# Patient Record
Sex: Female | Born: 1985 | Race: White | Hispanic: No | Marital: Single | Smoking: Never smoker
Health system: Southern US, Community
[De-identification: ages and names within clinical notes are randomized; demographics above are authoritative.]

---
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
## 2007-09-28 ENCOUNTER — Encounter: Admission: RE | Admit: 2007-09-28 | Discharge: 2007-09-28 | Payer: Self-pay

## 2015-10-27 DIAGNOSIS — M545 Low back pain: Secondary | ICD-10-CM | POA: Diagnosis not present

## 2015-11-16 DIAGNOSIS — M545 Low back pain: Secondary | ICD-10-CM | POA: Diagnosis not present

## 2016-01-17 DIAGNOSIS — Z6825 Body mass index (BMI) 25.0-25.9, adult: Secondary | ICD-10-CM | POA: Diagnosis not present

## 2016-01-17 DIAGNOSIS — Z113 Encounter for screening for infections with a predominantly sexual mode of transmission: Secondary | ICD-10-CM | POA: Diagnosis not present

## 2016-01-17 DIAGNOSIS — Z01419 Encounter for gynecological examination (general) (routine) without abnormal findings: Secondary | ICD-10-CM | POA: Diagnosis not present

## 2016-01-17 DIAGNOSIS — Z1159 Encounter for screening for other viral diseases: Secondary | ICD-10-CM | POA: Diagnosis not present

## 2016-01-17 DIAGNOSIS — Z36 Encounter for antenatal screening of mother: Secondary | ICD-10-CM | POA: Diagnosis not present

## 2016-03-07 DIAGNOSIS — F4322 Adjustment disorder with anxiety: Secondary | ICD-10-CM | POA: Diagnosis not present

## 2016-03-27 ENCOUNTER — Ambulatory Visit
Admission: RE | Admit: 2016-03-27 | Discharge: 2016-03-27 | Disposition: A | Discharge status: Home / Self Care | Payer: BLUE CROSS/BLUE SHIELD | Source: Ambulatory Visit | Attending: Family Medicine | Admitting: Family Medicine

## 2016-03-27 ENCOUNTER — Other Ambulatory Visit: Payer: Self-pay | Admitting: Family Medicine

## 2016-03-27 DIAGNOSIS — M546 Pain in thoracic spine: Secondary | ICD-10-CM | POA: Diagnosis not present

## 2016-03-27 DIAGNOSIS — R5383 Other fatigue: Secondary | ICD-10-CM | POA: Diagnosis not present

## 2016-03-27 DIAGNOSIS — M549 Dorsalgia, unspecified: Secondary | ICD-10-CM | POA: Diagnosis not present

## 2016-03-27 DIAGNOSIS — R52 Pain, unspecified: Secondary | ICD-10-CM

## 2016-03-29 DIAGNOSIS — F4322 Adjustment disorder with anxiety: Secondary | ICD-10-CM | POA: Diagnosis not present

## 2016-04-09 DIAGNOSIS — L650 Telogen effluvium: Secondary | ICD-10-CM | POA: Diagnosis not present

## 2016-04-09 DIAGNOSIS — Z79899 Other long term (current) drug therapy: Secondary | ICD-10-CM | POA: Diagnosis not present

## 2016-04-09 DIAGNOSIS — L659 Nonscarring hair loss, unspecified: Secondary | ICD-10-CM | POA: Diagnosis not present

## 2016-05-04 DIAGNOSIS — Z23 Encounter for immunization: Secondary | ICD-10-CM | POA: Diagnosis not present

## 2016-06-21 DIAGNOSIS — F4322 Adjustment disorder with anxiety: Secondary | ICD-10-CM | POA: Diagnosis not present

## 2016-06-25 DIAGNOSIS — E559 Vitamin D deficiency, unspecified: Secondary | ICD-10-CM | POA: Diagnosis not present

## 2016-07-08 DIAGNOSIS — J069 Acute upper respiratory infection, unspecified: Secondary | ICD-10-CM | POA: Diagnosis not present

## 2016-09-13 DIAGNOSIS — F4322 Adjustment disorder with anxiety: Secondary | ICD-10-CM | POA: Diagnosis not present

## 2016-09-30 DIAGNOSIS — F4322 Adjustment disorder with anxiety: Secondary | ICD-10-CM | POA: Diagnosis not present

## 2016-10-04 DIAGNOSIS — F4322 Adjustment disorder with anxiety: Secondary | ICD-10-CM | POA: Diagnosis not present

## 2016-10-11 DIAGNOSIS — F4322 Adjustment disorder with anxiety: Secondary | ICD-10-CM | POA: Diagnosis not present

## 2017-01-08 DIAGNOSIS — F4322 Adjustment disorder with anxiety: Secondary | ICD-10-CM | POA: Diagnosis not present

## 2017-01-24 DIAGNOSIS — F4322 Adjustment disorder with anxiety: Secondary | ICD-10-CM | POA: Diagnosis not present

## 2017-03-13 DIAGNOSIS — R194 Change in bowel habit: Secondary | ICD-10-CM | POA: Diagnosis not present

## 2017-03-13 DIAGNOSIS — Z800 Family history of malignant neoplasm of digestive organs: Secondary | ICD-10-CM | POA: Diagnosis not present

## 2017-03-13 DIAGNOSIS — R151 Fecal smearing: Secondary | ICD-10-CM | POA: Diagnosis not present

## 2017-03-29 IMAGING — CR DG THORACOLUMBAR SPINE 2V
2 series · 2 of 2 positions shown · non-contrast
Comparison: None in PACs

CLINICAL DATA: Mid thoracic and low back pain for the past several
weeks. Patient does a good bit of lifting with her job.

EXAM:
THORACOLUMBAR SPINE 1V

[w t-spine/l-spine junc. ap]
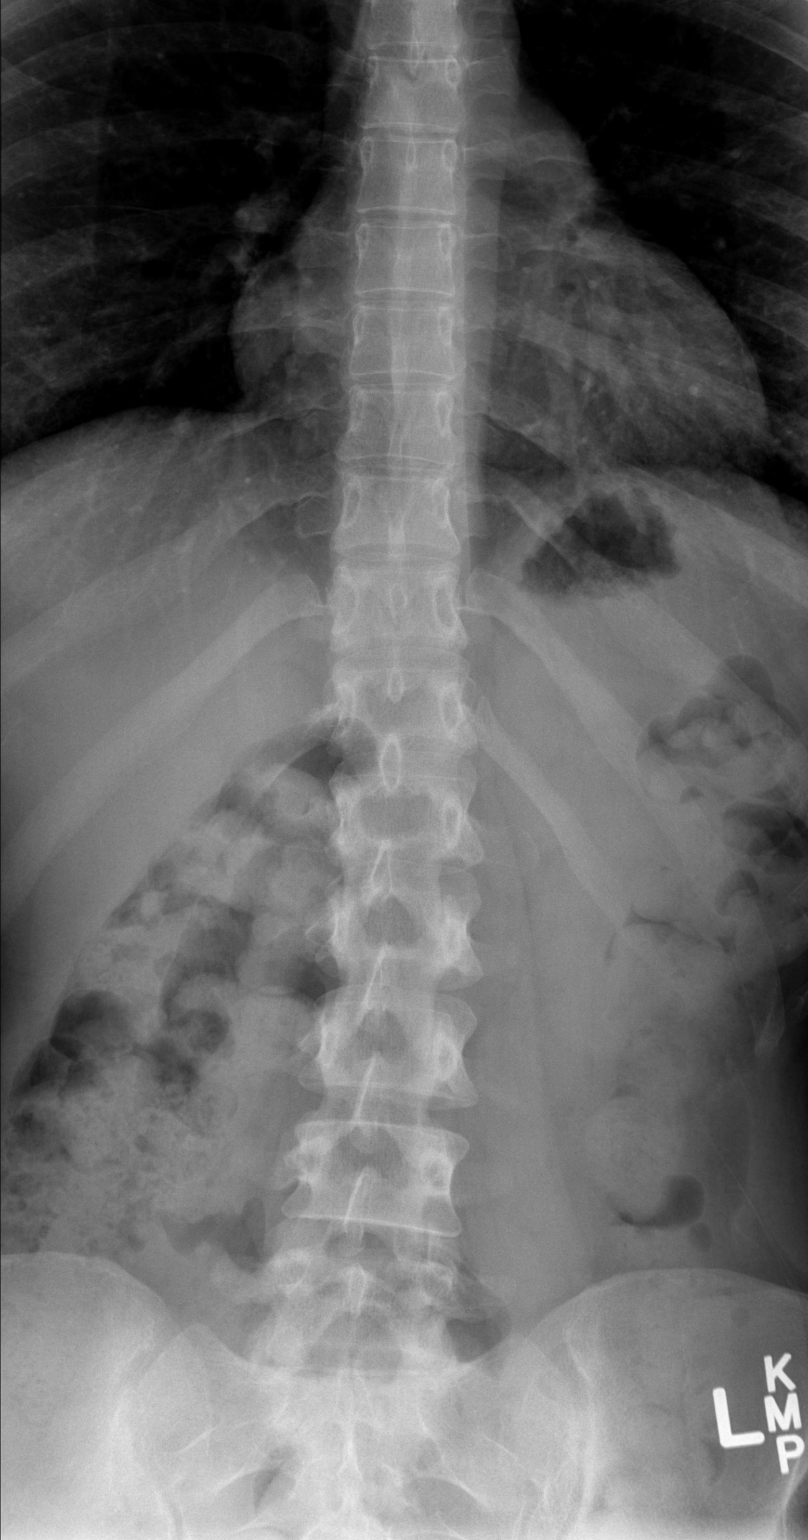

[w t-spine/l-spine junc lat]
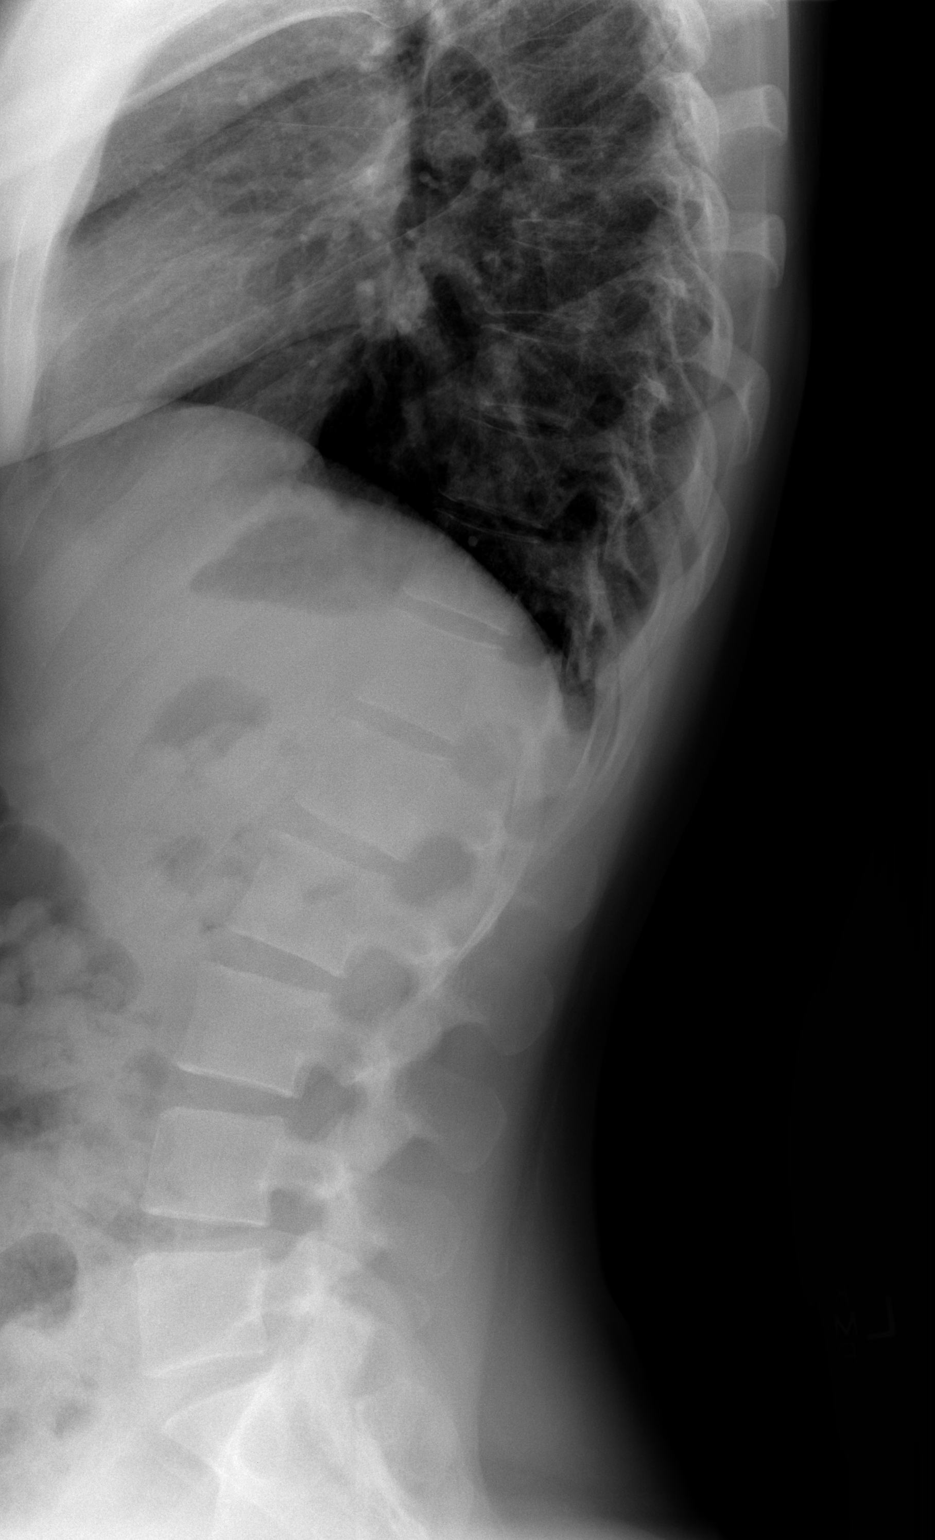

[2 of 2 positions shown; findings below may reference images not displayed]

FINDINGS: The thoracic spine from T5 inferiorly is included in the field of
view. The entire lumbar spine is included. The vertebral bodies are
preserved in height. The disc space heights are well maintained.
There is no spondylolisthesis. The pedicles are intact. The
transverse processes of the lumbar vertebral levels appear normal.
No abnormal paravertebral soft tissue densities are noted in the
lower thoracic spine.
IMPRESSION: There is no acute or significant chronic bony abnormality of the
visualized portions of the thoracic spine nor of the lumbar spine.

## 2017-04-03 DIAGNOSIS — Z800 Family history of malignant neoplasm of digestive organs: Secondary | ICD-10-CM | POA: Diagnosis not present

## 2017-04-03 DIAGNOSIS — Z1211 Encounter for screening for malignant neoplasm of colon: Secondary | ICD-10-CM | POA: Diagnosis not present

## 2017-04-03 DIAGNOSIS — K640 First degree hemorrhoids: Secondary | ICD-10-CM | POA: Diagnosis not present

## 2017-04-03 DIAGNOSIS — D125 Benign neoplasm of sigmoid colon: Secondary | ICD-10-CM | POA: Diagnosis not present

## 2017-04-08 DIAGNOSIS — D125 Benign neoplasm of sigmoid colon: Secondary | ICD-10-CM | POA: Diagnosis not present

## 2017-04-18 DIAGNOSIS — F419 Anxiety disorder, unspecified: Secondary | ICD-10-CM | POA: Diagnosis not present

## 2017-04-18 DIAGNOSIS — M722 Plantar fascial fibromatosis: Secondary | ICD-10-CM | POA: Diagnosis not present

## 2017-04-18 DIAGNOSIS — R41840 Attention and concentration deficit: Secondary | ICD-10-CM | POA: Diagnosis not present

## 2017-05-02 DIAGNOSIS — H5213 Myopia, bilateral: Secondary | ICD-10-CM | POA: Diagnosis not present

## 2017-05-02 DIAGNOSIS — H52203 Unspecified astigmatism, bilateral: Secondary | ICD-10-CM | POA: Diagnosis not present

## 2017-05-08 DIAGNOSIS — F4322 Adjustment disorder with anxiety: Secondary | ICD-10-CM | POA: Diagnosis not present

## 2017-05-15 DIAGNOSIS — F900 Attention-deficit hyperactivity disorder, predominantly inattentive type: Secondary | ICD-10-CM | POA: Diagnosis not present

## 2017-05-15 DIAGNOSIS — F4322 Adjustment disorder with anxiety: Secondary | ICD-10-CM | POA: Diagnosis not present

## 2017-05-19 DIAGNOSIS — F900 Attention-deficit hyperactivity disorder, predominantly inattentive type: Secondary | ICD-10-CM | POA: Diagnosis not present

## 2017-05-19 DIAGNOSIS — M25559 Pain in unspecified hip: Secondary | ICD-10-CM | POA: Diagnosis not present

## 2017-05-20 ENCOUNTER — Encounter (HOSPITAL_COMMUNITY): Payer: Self-pay

## 2017-05-20 ENCOUNTER — Emergency Department (HOSPITAL_COMMUNITY)
Admission: EM | Admit: 2017-05-20 | Discharge: 2017-05-21 | Disposition: A | Discharge status: Home / Self Care | Payer: BLUE CROSS/BLUE SHIELD | Attending: Emergency Medicine | Admitting: Emergency Medicine

## 2017-05-20 DIAGNOSIS — Y999 Unspecified external cause status: Secondary | ICD-10-CM | POA: Insufficient documentation

## 2017-05-20 DIAGNOSIS — Z23 Encounter for immunization: Secondary | ICD-10-CM | POA: Diagnosis not present

## 2017-05-20 DIAGNOSIS — S51802A Unspecified open wound of left forearm, initial encounter: Secondary | ICD-10-CM | POA: Insufficient documentation

## 2017-05-20 DIAGNOSIS — Y929 Unspecified place or not applicable: Secondary | ICD-10-CM | POA: Insufficient documentation

## 2017-05-20 DIAGNOSIS — Y939 Activity, unspecified: Secondary | ICD-10-CM | POA: Diagnosis not present

## 2017-05-20 DIAGNOSIS — W5501XA Bitten by cat, initial encounter: Secondary | ICD-10-CM | POA: Diagnosis not present

## 2017-05-20 MED ORDER — AMOXICILLIN-POT CLAVULANATE 875-125 MG PO TABS: 1.0000 | ORAL_TABLET | Freq: Two times a day (BID) | ORAL | 0 refills | Status: DC

## 2017-05-20 MED ORDER — TETANUS-DIPHTH-ACELL PERTUSSIS 5-2.5-18.5 LF-MCG/0.5 IM SUSP
0.5000 mL | Freq: Once | INTRAMUSCULAR | Status: AC
  Administered 2017-05-21: 0.5 mL via INTRAMUSCULAR
  Filled 2017-05-20: qty 0.5

## 2017-05-20 NOTE — ED Triage Notes (Signed)
Pt has a cat scratch, maybe tooth mark on her forearm, she wasn't concerned but then she felt a bump

## 2017-05-20 NOTE — Discharge Instructions (Signed)
You were seen today for a cat bite/scratch.  You will be given Augmentin to prevent infections.  If you notice streaking or redness up your arm, you need to be reevaluated immediately.

## 2017-05-20 NOTE — ED Notes (Signed)
Bed: WLPT2 Expected date:  Expected time:  Means of arrival:  Comments: 

## 2017-05-20 NOTE — ED Provider Notes (Signed)
[facility]-EMERGENCY DEPT Provider Note   CSN: [id number] Arrival date & time: 05/20/17  2132     History   Chief Complaint Chief Complaint  Patient presents with  . Animal Bite    HPI Tiffany Cummings is a 31 y.o. female.  HPI  This is a 31 year old female who presents with cat scratch and bite.  Patient reports that she is fostering to a 4617-month-old kittens.  They were cleared by the [facility] earlier this morning and received rabies shots at that time.  She had gone to pick 1 of the kittens up when he got spooked and scratched her hand.  She thinks she may have also been bit on the lateral aspect of her left arm.  Denies any significant pain.  Denies other injury.  Unknown last tetanus shot.  History reviewed. No pertinent past medical history.  There are no active problems to display for this patient.   History reviewed. No pertinent surgical history.  OB History    No data available       Home Medications    Prior to Admission medications   Medication Sig Start Date End Date Taking? Authorizing Provider  amoxicillin-clavulanate (AUGMENTIN) 875-125 MG tablet Take 1 tablet by mouth every 12 (twelve) hours. 05/20/17   Mitchael Luckey, Mayer Maskerourtney F, MD    Family History History reviewed. No pertinent family history.  Social History Social History  Substance Use Topics  . Smoking status: Never Smoker  . Smokeless tobacco: Never Used  . Alcohol use No     Allergies   Patient has no allergy information on record.   Review of Systems Review of Systems  Constitutional: Negative for fever.  Skin: Positive for wound. Negative for color change.  All other systems reviewed and are negative.    Physical Exam Updated Vital Signs BP (!) 140/91 (BP Location: Right Arm)   Pulse 87   Temp 98.4 F (36.9 C) (Oral)   Resp 16   SpO2 100%   Physical Exam  Constitutional: She is oriented to person, place, and time. She appears well-developed and  well-nourished. No distress.  HENT:  Head: Normocephalic and atraumatic.  Cardiovascular: Normal rate and regular rhythm.   Pulmonary/Chest: Effort normal. No respiratory distress.  Neurological: She is alert and oriented to person, place, and time.  Skin: Skin is warm and dry.  Scratch noted over the proximal aspect of the first digit of the left hand, one puncture wound noted of the lateral aspect of the forearm with mild bruising noted, no significant erythema or streaking  Psychiatric: She has a normal mood and affect.  Nursing note and vitals reviewed.    ED Treatments / Results  Labs (all labs ordered are listed, but only abnormal results are displayed) Labs Reviewed - No data to display  EKG  EKG Interpretation None       Radiology No results found.  Procedures Procedures (including critical care time)  Medications Ordered in ED Medications  Tdap (BOOSTRIX) injection 0.5 mL (not administered)     Initial Impression / Assessment and Plan / ED Course  I have reviewed the triage vital signs and the nursing notes.  Pertinent labs & imaging results that were available during my care of the patient were reviewed by me and considered in my medical decision making (see chart for details).     Agent presents with a cat scratch and possible bite.  No active signs of infection at this time.  Will wash out  and placed on Augmentin.  Patient was given instructions regarding signs and symptoms of infection.  Tdap was updated.  Recommend quarantine of the animal for 10 days to monitor for signs and symptoms of aggressive/rabid behavior.  After history, exam, and medical workup I feel the patient has been appropriately medically screened and is safe for discharge home. Pertinent diagnoses were discussed with the patient. Patient was given return precautions.   Final Clinical Impressions(s) / ED Diagnoses   Final diagnoses:  Cat bite, initial encounter    New  Prescriptions New Prescriptions   AMOXICILLIN-CLAVULANATE (AUGMENTIN) 875-125 MG TABLET    Take 1 tablet by mouth every 12 (twelve) hours.     Shon Baton, MD 05/21/17 [phone]

## 2017-06-02 DIAGNOSIS — F419 Anxiety disorder, unspecified: Secondary | ICD-10-CM | POA: Diagnosis not present

## 2017-06-02 DIAGNOSIS — H6690 Otitis media, unspecified, unspecified ear: Secondary | ICD-10-CM | POA: Diagnosis not present

## 2017-06-05 DIAGNOSIS — F4322 Adjustment disorder with anxiety: Secondary | ICD-10-CM | POA: Diagnosis not present

## 2017-06-05 DIAGNOSIS — F900 Attention-deficit hyperactivity disorder, predominantly inattentive type: Secondary | ICD-10-CM | POA: Diagnosis not present

## 2017-06-11 DIAGNOSIS — F4322 Adjustment disorder with anxiety: Secondary | ICD-10-CM | POA: Diagnosis not present

## 2017-06-11 DIAGNOSIS — F900 Attention-deficit hyperactivity disorder, predominantly inattentive type: Secondary | ICD-10-CM | POA: Diagnosis not present

## 2017-07-01 DIAGNOSIS — H6993 Unspecified Eustachian tube disorder, bilateral: Secondary | ICD-10-CM | POA: Diagnosis not present

## 2017-08-14 DIAGNOSIS — F900 Attention-deficit hyperactivity disorder, predominantly inattentive type: Secondary | ICD-10-CM | POA: Diagnosis not present

## 2017-08-14 DIAGNOSIS — F4322 Adjustment disorder with anxiety: Secondary | ICD-10-CM | POA: Diagnosis not present

## 2017-09-15 DIAGNOSIS — Z113 Encounter for screening for infections with a predominantly sexual mode of transmission: Secondary | ICD-10-CM | POA: Diagnosis not present

## 2017-09-15 DIAGNOSIS — B373 Candidiasis of vulva and vagina: Secondary | ICD-10-CM | POA: Diagnosis not present

## 2017-10-06 DIAGNOSIS — J029 Acute pharyngitis, unspecified: Secondary | ICD-10-CM | POA: Diagnosis not present

## 2017-10-06 DIAGNOSIS — J069 Acute upper respiratory infection, unspecified: Secondary | ICD-10-CM | POA: Diagnosis not present

## 2017-12-01 DIAGNOSIS — L709 Acne, unspecified: Secondary | ICD-10-CM | POA: Diagnosis not present

## 2017-12-01 DIAGNOSIS — L700 Acne vulgaris: Secondary | ICD-10-CM | POA: Diagnosis not present

## 2017-12-01 DIAGNOSIS — Z111 Encounter for screening for respiratory tuberculosis: Secondary | ICD-10-CM | POA: Diagnosis not present

## 2017-12-01 DIAGNOSIS — Z23 Encounter for immunization: Secondary | ICD-10-CM | POA: Diagnosis not present

## 2017-12-01 DIAGNOSIS — F419 Anxiety disorder, unspecified: Secondary | ICD-10-CM | POA: Diagnosis not present

## 2018-09-14 ENCOUNTER — Other Ambulatory Visit: Payer: Self-pay | Admitting: Family Medicine

## 2018-09-14 ENCOUNTER — Other Ambulatory Visit (HOSPITAL_COMMUNITY)
Admission: RE | Admit: 2018-09-14 | Discharge: 2018-09-14 | Disposition: A | Discharge status: Home / Self Care | Payer: BC Managed Care – PPO | Source: Ambulatory Visit | Attending: Family Medicine | Admitting: Family Medicine

## 2018-09-14 DIAGNOSIS — Z124 Encounter for screening for malignant neoplasm of cervix: Secondary | ICD-10-CM | POA: Insufficient documentation

## 2018-09-16 LAB — CYTOLOGY - PAP
Adequacy: ABSENT
Diagnosis: NEGATIVE

## 2019-07-09 DIAGNOSIS — S161XXA Strain of muscle, fascia and tendon at neck level, initial encounter: Secondary | ICD-10-CM | POA: Diagnosis not present

## 2019-07-09 DIAGNOSIS — N760 Acute vaginitis: Secondary | ICD-10-CM | POA: Diagnosis not present

## 2019-09-07 DIAGNOSIS — G479 Sleep disorder, unspecified: Secondary | ICD-10-CM | POA: Diagnosis not present

## 2019-09-07 DIAGNOSIS — N760 Acute vaginitis: Secondary | ICD-10-CM | POA: Diagnosis not present

## 2019-09-07 DIAGNOSIS — N939 Abnormal uterine and vaginal bleeding, unspecified: Secondary | ICD-10-CM | POA: Diagnosis not present

## 2019-10-07 DIAGNOSIS — N939 Abnormal uterine and vaginal bleeding, unspecified: Secondary | ICD-10-CM | POA: Diagnosis not present

## 2019-10-07 DIAGNOSIS — R946 Abnormal results of thyroid function studies: Secondary | ICD-10-CM | POA: Diagnosis not present

## 2019-10-07 DIAGNOSIS — D509 Iron deficiency anemia, unspecified: Secondary | ICD-10-CM | POA: Diagnosis not present

## 2019-10-08 ENCOUNTER — Ambulatory Visit: Payer: BC Managed Care – PPO | Attending: Internal Medicine

## 2019-10-08 DIAGNOSIS — Z23 Encounter for immunization: Secondary | ICD-10-CM

## 2019-10-08 NOTE — Progress Notes (Signed)
   Covid-19 Vaccination Clinic  Name:  FAVIOLA KLARE    MRN: [id number] DOB: Aug 15, 1985  10/08/2019  Ms. Tietje was observed post Covid-19 immunization for 15 minutes without incident. She was provided with Vaccine Information Sheet and instruction to access the V-Safe system.   Ms. Holzheimer was instructed to call 911 with any severe reactions post vaccine: Marland Kitchen Difficulty breathing  . Swelling of face and throat  . A fast heartbeat  . A bad rash all over body  . Dizziness and weakness   Immunizations Administered    Name Date Dose VIS Date Route   Pfizer COVID-19 Vaccine 10/08/2019  3:48 PM 0.3 mL 07/02/2019 Intramuscular   Manufacturer: [employer], [employer]   Lot: [id number]   NDC: [address]-9

## 2019-10-20 DIAGNOSIS — R895 Abnormal microbiological findings in specimens from other organs, systems and tissues: Secondary | ICD-10-CM | POA: Diagnosis not present

## 2019-10-20 DIAGNOSIS — Z113 Encounter for screening for infections with a predominantly sexual mode of transmission: Secondary | ICD-10-CM | POA: Diagnosis not present

## 2019-10-20 DIAGNOSIS — N939 Abnormal uterine and vaginal bleeding, unspecified: Secondary | ICD-10-CM | POA: Diagnosis not present

## 2019-11-03 ENCOUNTER — Ambulatory Visit: Payer: BC Managed Care – PPO | Attending: Internal Medicine

## 2019-11-03 DIAGNOSIS — Z23 Encounter for immunization: Secondary | ICD-10-CM

## 2019-11-03 NOTE — Progress Notes (Signed)
   Covid-19 Vaccination Clinic  Name:  Tiffany Cummings    MRN: [id number] DOB: [birth year]  11/03/2019  [name] was observed post Covid-19 immunization for 15 minutes without incident. She was provided with Vaccine Information Sheet and instruction to access the V-Safe system.   [name] was instructed to call 911 with any severe reactions post vaccine: Marland Kitchen Difficulty breathing  . Swelling of face and throat  . A fast heartbeat  . A bad rash all over body  . Dizziness and weakness   Immunizations Administered    Name Date Dose VIS Date Route   Pfizer COVID-19 Vaccine 11/03/2019  4:14 PM 0.3 mL 07/02/2019 Intramuscular   Manufacturer: [employer], [employer]   Lot: [id number]   NDC: [address]-5

## 2019-12-03 DIAGNOSIS — F4322 Adjustment disorder with anxiety: Secondary | ICD-10-CM | POA: Diagnosis not present

## 2019-12-03 DIAGNOSIS — F900 Attention-deficit hyperactivity disorder, predominantly inattentive type: Secondary | ICD-10-CM | POA: Diagnosis not present

## 2019-12-30 DIAGNOSIS — E039 Hypothyroidism, unspecified: Secondary | ICD-10-CM | POA: Diagnosis not present

## 2019-12-30 DIAGNOSIS — R0981 Nasal congestion: Secondary | ICD-10-CM | POA: Diagnosis not present

## 2020-01-26 DIAGNOSIS — N920 Excessive and frequent menstruation with regular cycle: Secondary | ICD-10-CM | POA: Diagnosis not present

## 2020-01-26 DIAGNOSIS — Z131 Encounter for screening for diabetes mellitus: Secondary | ICD-10-CM | POA: Diagnosis not present

## 2020-01-26 DIAGNOSIS — Z833 Family history of diabetes mellitus: Secondary | ICD-10-CM | POA: Diagnosis not present

## 2020-01-26 DIAGNOSIS — E559 Vitamin D deficiency, unspecified: Secondary | ICD-10-CM | POA: Diagnosis not present

## 2020-01-26 DIAGNOSIS — E039 Hypothyroidism, unspecified: Secondary | ICD-10-CM | POA: Diagnosis not present

## 2020-01-26 DIAGNOSIS — F900 Attention-deficit hyperactivity disorder, predominantly inattentive type: Secondary | ICD-10-CM | POA: Diagnosis not present

## 2020-01-26 DIAGNOSIS — R5383 Other fatigue: Secondary | ICD-10-CM | POA: Diagnosis not present

## 2020-01-26 DIAGNOSIS — Z13220 Encounter for screening for lipoid disorders: Secondary | ICD-10-CM | POA: Diagnosis not present

## 2020-01-26 DIAGNOSIS — F4322 Adjustment disorder with anxiety: Secondary | ICD-10-CM | POA: Diagnosis not present

## 2020-01-31 DIAGNOSIS — H04123 Dry eye syndrome of bilateral lacrimal glands: Secondary | ICD-10-CM | POA: Diagnosis not present

## 2020-01-31 DIAGNOSIS — H5213 Myopia, bilateral: Secondary | ICD-10-CM | POA: Diagnosis not present

## 2020-01-31 DIAGNOSIS — H52203 Unspecified astigmatism, bilateral: Secondary | ICD-10-CM | POA: Diagnosis not present

## 2022-05-22 ENCOUNTER — Other Ambulatory Visit (HOSPITAL_BASED_OUTPATIENT_CLINIC_OR_DEPARTMENT_OTHER): Payer: Self-pay

## 2022-05-22 MED ORDER — WEGOVY 0.5 MG/0.5ML ~~LOC~~ SOAJ
SUBCUTANEOUS | 0 refills | Status: DC
  Filled 2022-05-22: qty 2, 28d supply, fill #0

## 2022-05-22 MED ORDER — WEGOVY 0.25 MG/0.5ML ~~LOC~~ SOAJ
SUBCUTANEOUS | 0 refills | Status: DC
  Filled 2022-05-22: qty 2, 28d supply, fill #0

## 2022-05-27 ENCOUNTER — Other Ambulatory Visit (HOSPITAL_BASED_OUTPATIENT_CLINIC_OR_DEPARTMENT_OTHER): Payer: Self-pay

## 2022-06-06 ENCOUNTER — Other Ambulatory Visit (HOSPITAL_BASED_OUTPATIENT_CLINIC_OR_DEPARTMENT_OTHER): Payer: Self-pay

## 2022-06-28 ENCOUNTER — Other Ambulatory Visit (HOSPITAL_COMMUNITY): Payer: Self-pay

## 2022-07-25 ENCOUNTER — Other Ambulatory Visit (HOSPITAL_BASED_OUTPATIENT_CLINIC_OR_DEPARTMENT_OTHER): Payer: Self-pay

## 2022-08-01 ENCOUNTER — Other Ambulatory Visit (HOSPITAL_BASED_OUTPATIENT_CLINIC_OR_DEPARTMENT_OTHER): Payer: Self-pay

## 2022-08-02 ENCOUNTER — Other Ambulatory Visit (HOSPITAL_BASED_OUTPATIENT_CLINIC_OR_DEPARTMENT_OTHER): Payer: Self-pay

## 2022-08-07 ENCOUNTER — Other Ambulatory Visit: Payer: Self-pay

## 2022-08-14 ENCOUNTER — Other Ambulatory Visit (HOSPITAL_BASED_OUTPATIENT_CLINIC_OR_DEPARTMENT_OTHER): Payer: Self-pay

## 2022-08-16 ENCOUNTER — Encounter: Payer: Self-pay | Admitting: Adult Health

## 2022-08-16 ENCOUNTER — Ambulatory Visit (INDEPENDENT_AMBULATORY_CARE_PROVIDER_SITE_OTHER): Payer: BC Managed Care – PPO | Admitting: Adult Health

## 2022-08-16 VITALS — BP 129/85 | HR 75 | Ht 61.5 in | Wt 175.0 lb

## 2022-08-16 DIAGNOSIS — F422 Mixed obsessional thoughts and acts: Secondary | ICD-10-CM

## 2022-08-16 DIAGNOSIS — G4700 Insomnia, unspecified: Secondary | ICD-10-CM | POA: Diagnosis not present

## 2022-08-16 DIAGNOSIS — F331 Major depressive disorder, recurrent, moderate: Secondary | ICD-10-CM | POA: Diagnosis not present

## 2022-08-16 DIAGNOSIS — F411 Generalized anxiety disorder: Secondary | ICD-10-CM | POA: Diagnosis not present

## 2022-08-16 MED ORDER — CLONIDINE HCL 0.1 MG PO TABS: 0.1000 mg | ORAL_TABLET | Freq: Two times a day (BID) | ORAL | 2 refills | Status: DC

## 2022-08-16 MED ORDER — BUPROPION HCL ER (XL) 150 MG PO TB24: 150.0000 mg | ORAL_TABLET | Freq: Every day | ORAL | 2 refills | Status: DC

## 2022-08-16 NOTE — Progress Notes (Signed)
[facility] MD/PA/NP Initial Note  08/16/2022 10:15 AM Tiffany Cummings  MRN:  [id number]  Chief Complaint:   HPI:  Patient seen today for initial psychiatric evaluation.   Referred by therapist.  Describes mood today as "not the best". Tearful throughout interview. Pleasant. Mood symptoms - reports depression, anxiety, and irritability. Reports some worry, rumination, and over thinking. Reports obsessive thoughts. Mood is lower. Feels "stressed" out a lot of the time. Currently living with mother - needing to move out but can't. Coping with stressors by shopping and eating - has gained 60 pounds over the past 7 to 8 years. Stating "I also have a spending problem". Reports some financial difficulties related to spending habits. Moved in with parents 6 years ago after they were both diagnosed with cancer and is still living with mother. Father passed away 6 years ago with colon cancer. Mother in remission. Would like to be able to move back out on her own, but is struggling with finances. Diagnosed with OCD at age 34 or 76 - has taken various medications over the years - currently on Zoloft at 100mg  -- has taken as much as 200mg  daily, but was to numbing at that dose. Willing to consider other medication options to help manage mood symptoms. Has been working with a therapist - Glenetta Borg. Varying interest and motivation. Taking medications as prescribed.  Energy levels lower. Active, does not have a regular exercise routine.  Enjoys some usual interests and activities - traveling - going out with friends. Single. Not dating. Lives with mother - 2 cats and 2 dogs. Spending time with family and friends. Appetite adequate. Reports stress eating. Weight gain - 170 pounds. Sleeps well most nights. Averages 4 to 5 hours of broken sleep - waking up all throughout the night. Focus and concentration difficulties - diagnosed with ADHD - has a prescription for Adderall - does not take every day. Completing  tasks. Managing aspects of household. 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 Has her master degree.  Denies SI or HI.  Denies AH or VH. Denies self harm.  Denies substance use.   Previous medication trials: Zoloft, Ativan, Ambien, Lunesta, Hydroxyzine - bad dreams, Adderall XR, Adderall IR,  Visit Diagnosis:    ICD-10-CM   1. Major depressive disorder, recurrent episode, moderate (HCC)  F33.1 buPROPion (WELLBUTRIN XL) 150 MG 24 hr tablet    2. Mixed obsessional thoughts and acts  F42.2     3. Insomnia, unspecified type  G47.00 cloNIDine (CATAPRES) 0.1 MG tablet    4. Generalized anxiety disorder  F41.1 cloNIDine (CATAPRES) 0.1 MG tablet      Past Psychiatric History: Denies psychiatric hospitalization.   Past Medical History: No past medical history on file. No past surgical history on file.  Family Psychiatric History: Family history of mental illness - depression.   Family History: No family history on file.  Social History:  Social History   Socioeconomic History   Marital status: Single    Spouse name: Not on file   Number of children: Not on file   Years of education: Not on file   Highest education level: Not on file  Occupational History   Not on file  Tobacco Use   Smoking status: Never   Smokeless tobacco: Never  Substance and Sexual Activity   Alcohol use: No   Drug use: No   Sexual activity: Not on file  Other Topics Concern   Not on file  Social History Narrative  Not on file   Social Determinants of Health   Financial Resource Strain: Not on file  Food Insecurity: Not on file  Transportation Needs: Not on file  Physical Activity: Not on file  Stress: Not on file  Social Connections: Not on file    Allergies:  Allergies  Allergen Reactions   Flagyl [Metronidazole] Nausea And Vomiting   Ceclor [Cefaclor] Rash   Septra [Sulfamethoxazole-Trimethoprim] Rash    Metabolic Disorder Labs: No results found for: "HGBA1C",  "MPG" No results found for: "PROLACTIN" No results found for: "CHOL", "TRIG", "HDL", "CHOLHDL", "VLDL", "LDLCALC" No results found for: "TSH"  Therapeutic Level Labs: No results found for: "LITHIUM" No results found for: "VALPROATE" No results found for: "CBMZ"  Current Medications: Current Outpatient Medications  Medication Sig Dispense Refill   buPROPion (WELLBUTRIN XL) 150 MG 24 hr tablet Take 1 tablet (150 mg total) by mouth daily. 30 tablet 2   cloNIDine (CATAPRES) 0.1 MG tablet Take 1 tablet (0.1 mg total) by mouth 2 (two) times daily. 30 tablet 2   amoxicillin-clavulanate (AUGMENTIN) 875-125 MG tablet Take 1 tablet by mouth every 12 (twelve) hours. 14 tablet 0   Semaglutide-Weight Management (WEGOVY) 0.25 MG/0.5ML SOAJ Inject 0.25 mg into the skin once a week (for four weeks) 28 days 2 mL 0   Semaglutide-Weight Management (WEGOVY) 0.5 MG/0.5ML SOAJ Inject 0.5 mL into the skin once a week (for four weeks) 28 days 2 mL 0   No current facility-administered medications for this visit.    Medication Side Effects: none  Orders placed this visit:  No orders of the defined types were placed in this encounter.   Psychiatric Specialty Exam:  Review of Systems  Musculoskeletal:  Negative for gait problem.  Neurological:  Negative for tremors.  Psychiatric/Behavioral:         Please refer to HPI    Blood pressure 129/85, pulse 75, height 5' 1.5" (1.562 m), weight 175 lb (79.4 kg).Body mass index is 32.53 kg/m.  General Appearance: Casual and Neat  Eye Contact:  Good  Speech:  Clear and Coherent and Normal Rate  Volume:  Normal  Mood:  Anxious and Depressed  Affect:  Appropriate and Congruent  Thought Process:  Coherent and Descriptions of Associations: Intact  Orientation:  Full (Time, Place, and Person)  Thought Content: Logical   Suicidal Thoughts:  No  Homicidal Thoughts:  No  Memory:  WNL  Judgement:  Good  Insight:  Good  Psychomotor Activity:  Normal   Concentration:  Concentration: Good and Attention Span: Good  Recall:  Good  Fund of Knowledge: Good  Language: Good  Assets:  Communication Skills Desire for Improvement Financial Resources/Insurance Housing Intimacy Leisure Time Physical Health Resilience Social Support Talents/Skills Transportation Vocational/Educational  ADL's:  Intact  Cognition: WNL  Prognosis:  Good   Screenings: MDQ  Receiving Psychotherapy: Yes   Treatment Plan/Recommendations:   Plan:  PDMP reviewed  Increase Zoloft 100mg  daily - asked to consider increase to 150mg  daily Add Wellbutrin XL 150mg  every morning for depression - denies seizure history Add Clonidine 0.1mg  at hs for sleep  Add NAC tabs daily  Awaiting lab results from [place].  PCP at [facility].   Set up with therapist - previously seen by [facility] testing.   RTC 4 weeks  Patient advised to contact office with any questions, adverse effects, or acute worsening in signs and symptoms.    Aloha Gell, NP

## 2022-08-23 ENCOUNTER — Other Ambulatory Visit: Payer: Self-pay | Admitting: Adult Health

## 2022-08-23 DIAGNOSIS — G4700 Insomnia, unspecified: Secondary | ICD-10-CM

## 2022-08-23 DIAGNOSIS — F411 Generalized anxiety disorder: Secondary | ICD-10-CM

## 2022-08-30 ENCOUNTER — Other Ambulatory Visit (HOSPITAL_BASED_OUTPATIENT_CLINIC_OR_DEPARTMENT_OTHER): Payer: Self-pay

## 2022-08-30 MED ORDER — WEGOVY 1 MG/0.5ML ~~LOC~~ SOAJ
1.0000 mg | SUBCUTANEOUS | 0 refills | Status: DC
  Filled 2022-08-30: qty 2, 28d supply, fill #0

## 2022-08-30 MED ORDER — WEGOVY 1.7 MG/0.75ML ~~LOC~~ SOAJ: 1.7000 mg | SUBCUTANEOUS | 0 refills | Status: DC

## 2022-08-30 MED ORDER — WEGOVY 2.4 MG/0.75ML ~~LOC~~ SOAJ: 2.4000 mg | SUBCUTANEOUS | 0 refills | Status: DC

## 2022-09-04 ENCOUNTER — Other Ambulatory Visit (HOSPITAL_BASED_OUTPATIENT_CLINIC_OR_DEPARTMENT_OTHER): Payer: Self-pay

## 2022-09-05 ENCOUNTER — Other Ambulatory Visit (HOSPITAL_BASED_OUTPATIENT_CLINIC_OR_DEPARTMENT_OTHER): Payer: Self-pay

## 2022-09-06 ENCOUNTER — Ambulatory Visit: Payer: BC Managed Care – PPO | Admitting: Adult Health

## 2022-09-07 ENCOUNTER — Other Ambulatory Visit: Payer: Self-pay | Admitting: Adult Health

## 2022-09-07 DIAGNOSIS — F331 Major depressive disorder, recurrent, moderate: Secondary | ICD-10-CM

## 2022-09-12 ENCOUNTER — Other Ambulatory Visit (HOSPITAL_BASED_OUTPATIENT_CLINIC_OR_DEPARTMENT_OTHER): Payer: Self-pay

## 2022-09-23 ENCOUNTER — Ambulatory Visit (INDEPENDENT_AMBULATORY_CARE_PROVIDER_SITE_OTHER): Payer: BC Managed Care – PPO | Admitting: Adult Health

## 2022-09-23 ENCOUNTER — Encounter: Payer: Self-pay | Admitting: Adult Health

## 2022-09-23 DIAGNOSIS — F331 Major depressive disorder, recurrent, moderate: Secondary | ICD-10-CM

## 2022-09-23 DIAGNOSIS — F411 Generalized anxiety disorder: Secondary | ICD-10-CM | POA: Diagnosis not present

## 2022-09-23 DIAGNOSIS — F422 Mixed obsessional thoughts and acts: Secondary | ICD-10-CM | POA: Diagnosis not present

## 2022-09-23 DIAGNOSIS — G4700 Insomnia, unspecified: Secondary | ICD-10-CM

## 2022-09-23 NOTE — Progress Notes (Signed)
GENISIS YURCHAK TC:[id number] 1986/01/09 37 y.o.  Subjective:   Patient ID:  Tiffany Cummings is a 37 y.o. (DOB March 31, 1986) female.  Chief Complaint: No chief complaint on file.   HPI Tiffany Cummings presents to the office today for follow-up of MDD, GAD, obsessional thoughts, and insomnia.  Describes mood today as "maybe a little better". Decreased tearfulness. Pleasant. Mood symptoms - reports depression, anxiety, and irritability. Reports some worry, rumination, and over thinking. Reports obsessive thoughts and acts. Mood is lower overall. Has increased the Zoloft from '100mg'$  to '150mg'$  daily. Stating "I can tell a difference - more so with crying - not crying all the time any more". Feels more aware of her spending habits - "doing better". Did not start the Welbutrin or Clonidine. Continues to live with mother. Recent trip with friend's to [facility]. Was trialed on Wegovy by her endocrinologist - but did not tolerate it. Willing to consider other medication options to help manage mood symptoms. Has been working with a therapist - Glenetta Borg. Varying interest and motivation. Taking medications as prescribed.  Energy levels lower. Active, does not have a regular exercise routine.  Enjoys some usual interests and activities - traveling - getting with friends. Single. Not dating. Lives with mother - 2 cats and 2 dogs. Spending time with family and friends. Appetite adequate. Reports decreased stress eating while on Wegovy. Weight stable - 170 pounds. Sleeping difficulties. Averages 4 to 5 hours of broken sleep. Focus and concentration difficulties - diagnosed with ADHD - has a prescription for Adderall - not taking. Completing tasks. Managing aspects of household. 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 Has her master degree.  Denies SI or HI.  Denies AH or VH. Denies self harm.  Denies substance use.   Previous medication trials: Zoloft, Ativan, Ambien, Lunesta, Hydroxyzine - bad  dreams, Adderall XR, Adderall IR,     Review of Systems:  Review of Systems  Musculoskeletal:  Negative for gait problem.  Neurological:  Negative for tremors.  Psychiatric/Behavioral:         Please refer to HPI    Medications: I have reviewed the patient's current medications.  Current Outpatient Medications  Medication Sig Dispense Refill   amoxicillin-clavulanate (AUGMENTIN) 875-125 MG tablet Take 1 tablet by mouth every 12 (twelve) hours. 14 tablet 0   buPROPion (WELLBUTRIN XL) 150 MG 24 hr tablet TAKE 1 TABLET BY MOUTH EVERY DAY 90 tablet 0   cloNIDine (CATAPRES) 0.1 MG tablet TAKE 1 TABLET BY MOUTH 2 TIMES DAILY. 180 tablet 0   Semaglutide-Weight Management (WEGOVY) 0.25 MG/0.5ML SOAJ Inject 0.25 mg into the skin once a week (for four weeks) 28 days 2 mL 0   Semaglutide-Weight Management (WEGOVY) 0.5 MG/0.5ML SOAJ Inject 0.5 mL into the skin once a week (for four weeks) 28 days 2 mL 0   Semaglutide-Weight Management (WEGOVY) 1 MG/0.5ML SOAJ Inject 1 mg into the skin once a week. 2 mL 0   Semaglutide-Weight Management (WEGOVY) 1.7 MG/0.75ML SOAJ Inject 1.7 mg into the skin once a week. 3 mL 0   Semaglutide-Weight Management (WEGOVY) 2.4 MG/0.75ML SOAJ Inject 2.4 mg into the skin once a week. 3 mL 0   No current facility-administered medications for this visit.    Medication Side Effects: None  Allergies:  Allergies  Allergen Reactions   Flagyl [Metronidazole] Nausea And Vomiting   Ceclor [Cefaclor] Rash   Septra [Sulfamethoxazole-Trimethoprim] Rash    No past medical history on file.  Past Medical History, Surgical history, Social history, and Family history were reviewed and updated as appropriate.   Please see review of systems for further details on the patient's review from today.   Objective:   Physical Exam:  There were no vitals taken for this visit.  Physical Exam Constitutional:      General: She is not in acute distress. Musculoskeletal:         General: No deformity.  Neurological:     Mental Status: She is alert and oriented to person, place, and time.     Coordination: Coordination normal.  Psychiatric:        Attention and Perception: Attention and perception normal. She does not perceive auditory or visual hallucinations.        Mood and Affect: Mood normal. Mood is not anxious or depressed. Affect is not labile, blunt, angry or inappropriate.        Speech: Speech normal.        Behavior: Behavior normal.        Thought Content: Thought content normal. Thought content is not paranoid or delusional. Thought content does not include homicidal or suicidal ideation. Thought content does not include homicidal or suicidal plan.        Cognition and Memory: Cognition and memory normal.        Judgment: Judgment normal.     Comments: Insight intact     Lab Review:  No results found for: "NA", "K", "CL", "CO2", "GLUCOSE", "BUN", "CREATININE", "CALCIUM", "PROT", "ALBUMIN", "AST", "ALT", "ALKPHOS", "BILITOT", "GFRNONAA", "GFRAA"  No results found for: "WBC", "RBC", "HGB", "HCT", "PLT", "MCV", "MCH", "MCHC", "RDW", "LYMPHSABS", "MONOABS", "EOSABS", "BASOSABS"  No results found for: "POCLITH", "LITHIUM"   No results found for: "PHENYTOIN", "PHENOBARB", "VALPROATE", "CBMZ"   .res Assessment: Plan:    Plan:  PDMP reviewed  Zoloft '150mg'$  daily - PCP prescribes  Add Clonidine 0.'1mg'$  at hs for sleep - plans to try  NAC tabs daily  D/C Wellbutrin XL '150mg'$  every morning for depression - denies seizure history  Lab results from [place] available - will have information sent over  PCP at [facility].   Therapist - previously seen by [facility] testing - results given.   RTC 4 weeks  Patient advised to contact office with any questions, adverse effects, or acute worsening in signs and symptoms.   There are no diagnoses linked to this encounter.   Please see After Visit Summary  for patient specific instructions.  Future Appointments  Date Time Provider [place]  09/23/2022  3:40 PM Rojean Ige, Berdie Ogren, NP CP-CP None    No orders of the defined types were placed in this encounter.   -------------------------------

## 2022-09-26 ENCOUNTER — Telehealth: Payer: Self-pay | Admitting: Adult Health

## 2022-09-26 NOTE — Telephone Encounter (Signed)
Received med records from [facility] via fax. Placed in RM box to review

## 2022-09-26 NOTE — Telephone Encounter (Signed)
Noted. Ty.

## 2022-10-21 ENCOUNTER — Encounter: Payer: Self-pay | Admitting: Adult Health

## 2022-10-21 ENCOUNTER — Ambulatory Visit (INDEPENDENT_AMBULATORY_CARE_PROVIDER_SITE_OTHER): Payer: BC Managed Care – PPO | Admitting: Adult Health

## 2022-10-21 DIAGNOSIS — G4700 Insomnia, unspecified: Secondary | ICD-10-CM

## 2022-10-21 DIAGNOSIS — F331 Major depressive disorder, recurrent, moderate: Secondary | ICD-10-CM

## 2022-10-21 DIAGNOSIS — F422 Mixed obsessional thoughts and acts: Secondary | ICD-10-CM | POA: Diagnosis not present

## 2022-10-21 DIAGNOSIS — F411 Generalized anxiety disorder: Secondary | ICD-10-CM

## 2022-10-21 MED ORDER — TEMAZEPAM 30 MG PO CAPS: 30.0000 mg | ORAL_CAPSULE | Freq: Every evening | ORAL | 0 refills | Status: DC | PRN

## 2022-10-21 NOTE — Progress Notes (Signed)
Tiffany Cummings [id number] [birth year] 37 y.o.  Subjective:   Patient ID:  Tiffany Cummings is a 37 y.o. (DOB Feb 05, 1986) female.  Chief Complaint: No chief complaint on file.   HPI NIAMBI HYETT presents to the office today for follow-up of MDD, GAD, obsessional thoughts, and insomnia.  Describes mood today as "ok". Denies tearfulness. Pleasant. Mood symptoms - reports anxiety and irritability. Decreased depression. Reports some worry, rumination, and over thinking. Reports obsessive thoughts and acts. Reports some over spending - "not as much as before". Mood has improved overall. Feels like the Zoloft is helping with mood symptoms. Has been working with a therapist - Glenetta Borg. Varying interest and motivation. Taking medications as prescribed.  Energy levels lower. Active, does not have a regular exercise routine.  Enjoys some usual interests and activities - traveling - getting with friends. Single. Not dating. Lives with mother - 2 cats and 2 dogs. Spending time with family and friends. Appetite adequate. Restarted Wegovy last week. Weight stable - 170 pounds. Sleeping difficulties. Averages 4 to 5 hours of broken sleep. Focus and concentration difficulties - diagnosed with ADHD - has a prescription for Adderall - not taking. Completing tasks. Managing aspects of household. 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 Has her master degree.  Denies SI or HI.  Denies AH or VH. Denies self harm.  Denies substance use.   Previous medication trials: Zoloft, Ativan, Ambien, Lunesta, Hydroxyzine - bad dreams, Adderall XR, Adderall IR, Clonidine   Review of Systems:  Review of Systems  Musculoskeletal:  Negative for gait problem.  Neurological:  Negative for tremors.  Psychiatric/Behavioral:         Please refer to HPI    Medications: I have reviewed the patient's current medications.  Current Outpatient Medications  Medication Sig Dispense Refill   temazepam  (RESTORIL) 30 MG capsule Take 1 capsule (30 mg total) by mouth at bedtime as needed for sleep. 30 capsule 0   amoxicillin-clavulanate (AUGMENTIN) 875-125 MG tablet Take 1 tablet by mouth every 12 (twelve) hours. 14 tablet 0   buPROPion (WELLBUTRIN XL) 150 MG 24 hr tablet TAKE 1 TABLET BY MOUTH EVERY DAY 90 tablet 0   cloNIDine (CATAPRES) 0.1 MG tablet TAKE 1 TABLET BY MOUTH 2 TIMES DAILY. 180 tablet 0   Semaglutide-Weight Management (WEGOVY) 0.25 MG/0.5ML SOAJ Inject 0.25 mg into the skin once a week (for four weeks) 28 days 2 mL 0   Semaglutide-Weight Management (WEGOVY) 0.5 MG/0.5ML SOAJ Inject 0.5 mL into the skin once a week (for four weeks) 28 days 2 mL 0   Semaglutide-Weight Management (WEGOVY) 1 MG/0.5ML SOAJ Inject 1 mg into the skin once a week. 2 mL 0   Semaglutide-Weight Management (WEGOVY) 1.7 MG/0.75ML SOAJ Inject 1.7 mg into the skin once a week. 3 mL 0   Semaglutide-Weight Management (WEGOVY) 2.4 MG/0.75ML SOAJ Inject 2.4 mg into the skin once a week. 3 mL 0   No current facility-administered medications for this visit.    Medication Side Effects: None  Allergies:  Allergies  Allergen Reactions   Flagyl [Metronidazole] Nausea And Vomiting   Ceclor [Cefaclor] Rash   Septra [Sulfamethoxazole-Trimethoprim] Rash    No past medical history on file.  Past Medical History, Surgical history, Social history, and Family history were reviewed and updated as appropriate.   Please see review of systems for further details on the patient's review from today.   Objective:   Physical Exam:  There were no vitals  taken for this visit.  Physical Exam Constitutional:      General: She is not in acute distress. Musculoskeletal:        General: No deformity.  Neurological:     Mental Status: She is alert and oriented to person, place, and time.     Coordination: Coordination normal.  Psychiatric:        Attention and Perception: Attention and perception normal. She does not  perceive auditory or visual hallucinations.        Mood and Affect: Mood normal. Mood is not anxious or depressed. Affect is not labile, blunt, angry or inappropriate.        Speech: Speech normal.        Behavior: Behavior normal.        Thought Content: Thought content normal. Thought content is not paranoid or delusional. Thought content does not include homicidal or suicidal ideation. Thought content does not include homicidal or suicidal plan.        Cognition and Memory: Cognition and memory normal.        Judgment: Judgment normal.     Comments: Insight intact     Lab Review:  No results found for: "NA", "K", "CL", "CO2", "GLUCOSE", "BUN", "CREATININE", "CALCIUM", "PROT", "ALBUMIN", "AST", "ALT", "ALKPHOS", "BILITOT", "GFRNONAA", "GFRAA"  No results found for: "WBC", "RBC", "HGB", "HCT", "PLT", "MCV", "MCH", "MCHC", "RDW", "LYMPHSABS", "MONOABS", "EOSABS", "BASOSABS"  No results found for: "POCLITH", "LITHIUM"   No results found for: "PHENYTOIN", "PHENOBARB", "VALPROATE", "CBMZ"   .res Assessment: Plan:    Plan:  PDMP reviewed  Zoloft 150mg  daily - PCP prescribes  Consider Ritalin next visit  Add Restoril 30mg  at hs  D/C Clonidine 0.1mg  at hs for sleep   NAC tabs daily  Lab results from [place] [facility] available - will have information sent over  PCP at [facility].   Therapist - previously seen by Lelon Mast testing - results given.   RTC 4 weeks  Patient advised to contact office with any questions, adverse effects, or acute worsening in signs and symptoms.  Diagnoses and all orders for this visit:  Major depressive disorder, recurrent episode, moderate  Insomnia, unspecified type -     temazepam (RESTORIL) 30 MG capsule; Take 1 capsule (30 mg total) by mouth at bedtime as needed for sleep.  Generalized anxiety disorder  Mixed obsessional thoughts and acts     Please see After Visit Summary for patient  specific instructions.  No future appointments.  No orders of the defined types were placed in this encounter.   -------------------------------

## 2022-11-07 ENCOUNTER — Ambulatory Visit: Payer: BC Managed Care – PPO | Admitting: Psychiatry

## 2022-11-07 DIAGNOSIS — F411 Generalized anxiety disorder: Secondary | ICD-10-CM

## 2022-11-07 NOTE — Progress Notes (Signed)
[facility] Counselor Initial Adult Exam  Name: Tiffany Cummings Date: 11/07/2022 MRN: [id number] DOB: 09-08-1985 PCP: Maurice Small, MD  Time spent: 60 minutes  Guardian/Payee:  patient    Paperwork requested:  No   Reason for Visit /Presenting Problem:  anxiety, some depression previously but it's better right now, easy to get overwhelmed, work very stressful Printmaker),  obsessive and avoidant  Mental Status Exam:    Appearance:   Casual and Neat     Behavior:  Appropriate, Sharing, and I'm motivated but hard to get started on things  Motor:  Normal  Speech/Language:   Clear and Coherent  Affect:  Anxious, some mild depression  Mood:  anxious and depression improved  Thought process:  goal directed  Thought content:    Obsessive thoughts (dx'd at age 55)  Sensory/Perceptual disturbances:    WNL  Orientation:  oriented to person, place, time/date, situation, day of week, month of year, year, and stated date of November 07, 2022  Attention:  Good  Concentration:  Good and Fair  Memory:  WNL  Fund of knowledge:   Good  Insight:    Good  Judgment:   Good  Impulse Control:  Typically good but "in shopping it is very poor"   Reported Symptoms:  see symptoms above  Risk Assessment: Danger to Self:  No Self-injurious Behavior: No Danger to Others: No Duty to Warn:no Physical Aggression / Violence:No  Access to Firearms a concern: No  Gang Involvement:No  Patient / guardian was educated about steps to take if suicide or homicide risk level increases between visits: Denies any thoughts to harm self or others.  While future psychiatric events cannot be accurately predicted, the patient does not currently require acute inpatient psychiatric care and does not currently meet [facility] involuntary commitment criteria.  Substance Abuse History: Current substance abuse: No     Past Psychiatric History:   Previous psychological history is significant for anxiety, depression, and  has seen a therapist a few times since grad school Outpatient Providers:in [place], [place] History of Psych Hospitalization: No  Psychological Testing:  n/a    Abuse History: Victim of No.,  n/a    Report needed: No. Victim of Neglect:No. Perpetrator of  n/a   Witness / Exposure to Domestic Violence: No   Protective Services Involvement: No  Witness to [employer] Violence:  No   Family History: No family history on file. (Patient provided some in sections below.)  Living situation: the patient lives with their Mom, never married "yet"  Sexual Orientation:  Straight  Relationship Status: single  Name of spouse / other:n/a             If a parent, number of children / ages:none  Support Systems; friends Parents, mom is living and dad died 6 yrs ago  Surveyor, quantity Stress:  Yes   Income/Employment/Disability: Employment  Financial planner: No   Educational History: Education:  graduate school  Religion/Sprituality/World View:    does belong to a church, Catholic  Any cultural differences that may affect / interfere with treatment:  not applicable   Recreation/Hobbies: walking my dog, traveling, baking, reading, has 2 rescue dogs  Stressors:Loss of dad 6 yrs ago and that was very difficult for patient; some conflict with mom    Strengths:  Supportive Relationships, Family, Friends, Church, Spirituality, Hopefulness, and Self Advocate, Able to communicate effectively  Barriers:  "not sure, but think eventually moving out on my own will help.".  Currently lives with mom.  Legal History: Pending legal issue / charges: The patient has no significant history of legal issues. History of legal issue / charges:  none  Medical History/Surgical History:Patient reports no significant medical history thus far in her life.  No past medical history on file.  No past surgical history on file.  Medications: Current Outpatient Medications  Medication Sig Dispense Refill    amoxicillin-clavulanate (AUGMENTIN) 875-125 MG tablet Take 1 tablet by mouth every 12 (twelve) hours. 14 tablet 0   buPROPion (WELLBUTRIN XL) 150 MG 24 hr tablet TAKE 1 TABLET BY MOUTH EVERY DAY 90 tablet 0   cloNIDine (CATAPRES) 0.1 MG tablet TAKE 1 TABLET BY MOUTH 2 TIMES DAILY. 180 tablet 0   Semaglutide-Weight Management (WEGOVY) 0.25 MG/0.5ML SOAJ Inject 0.25 mg into the skin once a week (for four weeks) 28 days 2 mL 0   Semaglutide-Weight Management (WEGOVY) 0.5 MG/0.5ML SOAJ Inject 0.5 mL into the skin once a week (for four weeks) 28 days 2 mL 0   Semaglutide-Weight Management (WEGOVY) 1 MG/0.5ML SOAJ Inject 1 mg into the skin once a week. 2 mL 0   Semaglutide-Weight Management (WEGOVY) 1.7 MG/0.75ML SOAJ Inject 1.7 mg into the skin once a week. 3 mL 0   Semaglutide-Weight Management (WEGOVY) 2.4 MG/0.75ML SOAJ Inject 2.4 mg into the skin once a week. 3 mL 0   temazepam (RESTORIL) 30 MG capsule Take 1 capsule (30 mg total) by mouth at bedtime as needed for sleep. 30 capsule 0   No current facility-administered medications for this visit.    Allergies  Allergen Reactions   Flagyl [Metronidazole] Nausea And Vomiting   Ceclor [Cefaclor] Rash   Septra [Sulfamethoxazole-Trimethoprim] Rash    Diagnoses:    ICD-10-CM   1. Generalized anxiety disorder  F41.1      Treatment goals and plan of care: Patient not signing treatment goal plan on computer screen due to continued concerns with COVID and other issues.  She did participate in plan development and is aware of her initial treatment goals with this therapist.  Progress is assessed each session and documented in the treatment note.  1.  Reduce overall level, frequency, and intensity of the anxiety and some depression, so that daily functioning is not impaired. 2.  Identify major life conflicts from the past and present that form the basis for present anxiety and some depression. 3.  Reinforced patient's insights into the role of her  past emotional pain and present anxiety/depression.  Have patient develop and process a list of keep past and present life conflicts continue to create worry. 4.  Develop behavioral and cognitive strategies to reduce/eliminate irrational anxiety. 5.  Assist patient in developing coping strategies for her anxiety/depression, making sure to notice progress as it occurs and build up on the progress.   Plan of Care:  Today is [employer] initial session with this therapist and we together completed her initial evaluation for therapy and initial treatment goal plan.  Tiffany Cummings is a 37 yr old, single female, dates occasionally, and lives with her mother. Dad died of colon cancer 6 yrs ago. Was stage 4 when cancer was detected, and ended up having stroke and died. That loss has been difficult for patient. She has no brothers nor sisters.  Patient denies any SI. Mom is former ER and cardiology nurse, and has strong feelings about things.  Patient does try to be supportive and helpful with mother but also needing healthier boundaries.  Patient is wanting to eventually be able to  move out on her own without guilt of leaving mother.  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  She has some educational history and other areas and may eventually explore more options.  She does not mention her job is being a strong factor in her coming for treatment but feels like there are more obstacles for her that are not related to her job and more personal/family related, and she was able to give several examples of her concerns.  (Not all details included in this note due to patient privacy needs).  Patient identifies as Catholic and does belong to a church.  Medically, she reports no significant issues currently other than her emotional health issues.  Patient enjoys walking her dogs as she has 2 rescue dogs, baking, traveling, and reading.   She sees her strengths as being supportive relationships, family, friends, church, spirituality, hopefulness, being a good self advocate, and she is able to communicate effectively.  Her biggest stressor she reports as being the loss of her dad 6 years ago as that was very difficult for patient and still is in some ways.  That incident also led to more conflict with mom at times as dad seem to be the "buffer" between the 2 of them.  Does admit that one of her biggest weaknesses is "impulsive shopping" and did not realize how much of an issue it was until more recently.  Is motivated to work on this on as part of her focus and therapy.  Patient is very nonblaming and her approach today, even in her talk about the issues between her and her mother, stating that they both contribute to the situation but she is concerned long-term about having a healthier relationship with mother and being able to make choices and have attitudes that will help patient have as healthy of a relationship as possible with her mother and be able to move forward making good decisions that are in her best interest.  Is hoping that therapy will provide her with a place to talk through and better manage her stressors as this has been helpful to her in previous therapy with other providers.  Further information on this patient including additional family history, personal history, risk assessment, and medical history can be found in the above sections of this full initial evaluation for therapy.  Review of initial treatment goal plan and patient is in agreement.  Next appointment within 2 weeks.  This record has been created using [employer].  Chart creation errors have been sought, but may not always have been located and corrected.  Such creation errors do not reflect on the standard of medical care provided.   Mathis Fare, LCSW

## 2022-11-18 ENCOUNTER — Ambulatory Visit: Payer: BC Managed Care – PPO | Admitting: Psychiatry

## 2022-11-18 DIAGNOSIS — F411 Generalized anxiety disorder: Secondary | ICD-10-CM | POA: Diagnosis not present

## 2022-11-18 NOTE — Progress Notes (Signed)
[facility] Counselor/Therapist Progress Note  Patient ID: ICELA GLYMPH, MRN: [id number],    Date: 11/18/2022  Time Spent: 50 minutes   Treatment Type: Individual Therapy  Reported Symptoms: anxiety, some depression  Mental Status Exam:  Appearance:   Casual     Behavior:  Appropriate, Sharing, and Motivated  Motor:  Normal  Speech/Language:   Clear and Coherent  Affect:  Anxiety, some depression  Mood:  anxious and depressed  Thought process:  goal directed  Thought content:    WNL  Sensory/Perceptual disturbances:    WNL  Orientation:  oriented to person, place, time/date, situation, day of week, month of year, year, and stated date of November 18, 2022  Attention:  Good  Concentration:  Good  Memory:  WNL  Fund of knowledge:   Good  Insight:    Good  Judgment:   Good and Fair  Impulse Control:  Good and Fair   Risk Assessment: Danger to Self:  No Self-injurious Behavior: No Danger to Others: No Duty to Warn:no Physical Aggression / Violence:No  Access to Firearms a concern: No  Gang Involvement:No   Subjective:  Patient in today reporting anxiety, frustration, and difficulty coping with situation of living with her Mom. Mom is a Chartered loss adjuster and this is very problematic for patient. Mom can't make decisions, and is frequently "telling me" what to do. Discussed ways for patient to better cope with her challenging situation and change the way she is communicating to be more effective with her mother and others.  Several examples shared with patient and discussed how each one can be helpful to her in communicating with her mother and others, and also in some of the internal messages she is giving to herself about her current situation and the stress she is feeling.  Is to work on finding her prior resume as discussed in session, so she can sign up for the online seminar she is wanting to complete.  Feels that that seminar will help her moving forward as she is seeking to make  a career change.  Discuss more strategies in helping her mother including specific communication strategies that are clear versus more wordy as mother does not understand as well when explanations contain a lot of of information and she does better with "to the point more" explanations.  Has a cousin living locally that is supportive for patient.  Acknowledges that the frustrations she experiences at home have led her to be more impulsive with her "over-shopping" habit.  Plan to work on that habit more next session and follow-up on the work she did today regarding her and mother, and her anticipated job change.  Interventions: Cognitive Behavioral Therapy and Ego-Supportive Goals- 1.  Reduce overall level, frequency, and intensity of the anxiety and some depression, so that daily functioning is not impaired. 2.  Identify major life conflicts from the past and present that form the basis for present anxiety and some depression. 3.  Reinforced patient's insights into the role of her past emotional pain and present anxiety/depression.  Have patient develop and process a list of keep past and present life conflicts continue to create worry. 4.  Develop behavioral and cognitive strategies to reduce/eliminate irrational anxiety. 5.  Assist patient in developing coping strategies for her anxiety/depression, making sure to notice progress as it occurs and build up on the progress.   Diagnosis:   ICD-10-CM   1. Generalized anxiety disorder  F41.1      Plan:  Patient today showing good motivation and working on her anxiety, frustration, and difficulty coping with the situation of living in the home with her mother for several different reasons, some of which are noted above.  Encouraged patient in practicing more consistent positive behaviors including: Staying in the present and focusing on what she can change or control, recognize and emphasize her strengths, look for more positives versus negatives daily,  stay in touch with supportive people, healthy nutrition and exercise, practice consistent positive self talk, reduce overthinking and over analyzing, challenge and counteract self-doubt, interrupt negative/anxious thoughts and challenge them with more reality based thoughts, saying no without feeling guilty, reflect on progress made, be proactive in addressing obstacles/challenges, and recognize the strength she shows as she works with goal-directed behaviors to move in a direction that supports increased confidence, improved emotional health, and overall outlook.  Goal review and progress/challenges noted with patient.  Next appointment within 2 weeks.   Mathis Fare, LCSW

## 2022-11-19 ENCOUNTER — Encounter: Payer: Self-pay | Admitting: Adult Health

## 2022-11-19 ENCOUNTER — Ambulatory Visit (INDEPENDENT_AMBULATORY_CARE_PROVIDER_SITE_OTHER): Payer: BC Managed Care – PPO | Admitting: Adult Health

## 2022-11-19 DIAGNOSIS — F411 Generalized anxiety disorder: Secondary | ICD-10-CM | POA: Diagnosis not present

## 2022-11-19 DIAGNOSIS — F422 Mixed obsessional thoughts and acts: Secondary | ICD-10-CM

## 2022-11-19 DIAGNOSIS — F331 Major depressive disorder, recurrent, moderate: Secondary | ICD-10-CM

## 2022-11-19 DIAGNOSIS — G4700 Insomnia, unspecified: Secondary | ICD-10-CM

## 2022-11-19 MED ORDER — TRAZODONE HCL 50 MG PO TABS: ORAL_TABLET | ORAL | 2 refills | Status: DC

## 2022-11-19 NOTE — Progress Notes (Signed)
Tiffany Cummings [id number] 1986-03-27 36 y.o.  Subjective:   Patient ID:  Tiffany Cummings is a 37 y.o. (DOB Dec 11, 1985) female.  Chief Complaint: No chief complaint on file.   HPI Tiffany Cummings presents to the office today for follow-up of MDD, GAD, obsessional thoughts, and insomnia.  Describes mood today as "ok". Denies tearfulness. Pleasant. Mood symptoms - reports anxiety and irritability. Denies depression. Reports some worry, rumination, and over thinking. Reports obsessive thoughts and acts - counting to 20 seconds when washing hands. Reports some over spending  Mood has improved. Feels like the Zoloft is helping with mood symptoms. Has been working with a therapist - Estill Bakes. Varying interest and motivation. Taking medications as prescribed.  Energy levels lower. Active, does not have a regular exercise routine. Walking daily. Enjoys some usual interests and activities - traveling - getting with friends. Single. Not dating. Lives with mother - 2 cats and 2 dogs. Spending time with family and friends. Appetite adequate. Restarted Wegovy last week. Weight loss - 165 pounds. Sleeping difficulties. Averages 4.5 hours of broken sleep. Focus and concentration difficulties - diagnosed with ADHD. Completing tasks. Managing aspects of household. 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 Has her master degree.  Denies SI or HI.  Denies AH or VH. Denies self harm.  Denies substance use.   Previous medication trials: Zoloft, Ativan - crying all the time, Ambien, Lunesta, Hydroxyzine - bad dreams, Adderall XR, Adderall IR, Clonidine, Restoril      Review of Systems:  Review of Systems  Musculoskeletal:  Negative for gait problem.  Neurological:  Negative for tremors.  Psychiatric/Behavioral:         Please refer to HPI    Medications: I have reviewed the patient's current medications.  Current Outpatient Medications  Medication Sig Dispense Refill    amoxicillin-clavulanate (AUGMENTIN) 875-125 MG tablet Take 1 tablet by mouth every 12 (twelve) hours. 14 tablet 0   buPROPion (WELLBUTRIN XL) 150 MG 24 hr tablet TAKE 1 TABLET BY MOUTH EVERY DAY 90 tablet 0   cloNIDine (CATAPRES) 0.1 MG tablet TAKE 1 TABLET BY MOUTH 2 TIMES DAILY. 180 tablet 0   Semaglutide-Weight Management (WEGOVY) 0.25 MG/0.5ML SOAJ Inject 0.25 mg into the skin once a week (for four weeks) 28 days 2 mL 0   Semaglutide-Weight Management (WEGOVY) 0.5 MG/0.5ML SOAJ Inject 0.5 mL into the skin once a week (for four weeks) 28 days 2 mL 0   Semaglutide-Weight Management (WEGOVY) 1 MG/0.5ML SOAJ Inject 1 mg into the skin once a week. 2 mL 0   Semaglutide-Weight Management (WEGOVY) 1.7 MG/0.75ML SOAJ Inject 1.7 mg into the skin once a week. 3 mL 0   Semaglutide-Weight Management (WEGOVY) 2.4 MG/0.75ML SOAJ Inject 2.4 mg into the skin once a week. 3 mL 0   temazepam (RESTORIL) 30 MG capsule Take 1 capsule (30 mg total) by mouth at bedtime as needed for sleep. 30 capsule 0   No current facility-administered medications for this visit.    Medication Side Effects: None  Allergies:  Allergies  Allergen Reactions   Flagyl [Metronidazole] Nausea And Vomiting   Ceclor [Cefaclor] Rash   Septra [Sulfamethoxazole-Trimethoprim] Rash    No past medical history on file.  Past Medical History, Surgical history, Social history, and Family history were reviewed and updated as appropriate.   Please see review of systems for further details on the patient's review from today.   Objective:   Physical Exam:  There were no  vitals taken for this visit.  Physical Exam Constitutional:      General: She is not in acute distress. Musculoskeletal:        General: No deformity.  Neurological:     Mental Status: She is alert and oriented to person, place, and time.     Coordination: Coordination normal.  Psychiatric:        Attention and Perception: Attention and perception normal. She does  not perceive auditory or visual hallucinations.        Mood and Affect: Mood normal. Mood is not anxious or depressed. Affect is not labile, blunt, angry or inappropriate.        Speech: Speech normal.        Behavior: Behavior normal.        Thought Content: Thought content normal. Thought content is not paranoid or delusional. Thought content does not include homicidal or suicidal ideation. Thought content does not include homicidal or suicidal plan.        Cognition and Memory: Cognition and memory normal.        Judgment: Judgment normal.     Comments: Insight intact     Lab Review:  No results found for: "NA", "K", "CL", "CO2", "GLUCOSE", "BUN", "CREATININE", "CALCIUM", "PROT", "ALBUMIN", "AST", "ALT", "ALKPHOS", "BILITOT", "GFRNONAA", "GFRAA"  No results found for: "WBC", "RBC", "HGB", "HCT", "PLT", "MCV", "MCH", "MCHC", "RDW", "LYMPHSABS", "MONOABS", "EOSABS", "BASOSABS"  No results found for: "POCLITH", "LITHIUM"   No results found for: "PHENYTOIN", "PHENOBARB", "VALPROATE", "CBMZ"   .res Assessment: Plan:    Plan:  PDMP reviewed  Zoloft 150mg  daily - PCP prescribes  Add Trazadone 50mg  hs  Consider Ritalin next visit  Consider: Clonazepam Seroquel  NAC tabs daily  Lab results from [facility] available - will have information sent over  PCP at [facility].   Therapist - previously seen by Nehemiah Settle testing - results given.   RTC 4 weeks  Patient advised to contact office with any questions, adverse effects, or acute worsening in signs and symptoms. Diagnoses and all orders for this visit:  Major depressive disorder, recurrent episode, moderate (HCC)  Insomnia, unspecified type  Generalized anxiety disorder  Mixed obsessional thoughts and acts     Please see After Visit Summary for patient specific instructions.  Future Appointments  Date Time Provider Department Center  12/05/2022  4:00 PM Mathis Fare, LCSW [facility] None  12/18/2022  4:00 PM Mathis Fare, LCSW [facility] None  01/01/2023 11:00 AM Mathis Fare, LCSW [facility] None    No orders of the defined types were placed in this encounter.   -------------------------------

## 2022-11-24 ENCOUNTER — Other Ambulatory Visit: Payer: Self-pay | Admitting: Adult Health

## 2022-11-24 DIAGNOSIS — G4700 Insomnia, unspecified: Secondary | ICD-10-CM

## 2022-11-24 DIAGNOSIS — F411 Generalized anxiety disorder: Secondary | ICD-10-CM

## 2022-11-29 ENCOUNTER — Telehealth: Payer: Self-pay | Admitting: Adult Health

## 2022-11-29 MED ORDER — QUETIAPINE FUMARATE 50 MG PO TABS: 50.0000 mg | ORAL_TABLET | Freq: Every day | ORAL | 1 refills | Status: DC

## 2022-11-29 NOTE — Telephone Encounter (Signed)
Patient reporting that 2 trazodone were still not helping with sleep. The temazepam was not effective either. I don't see any other medications typically used for sleep have been tried.

## 2022-11-29 NOTE — Telephone Encounter (Signed)
She has taken Ativan, Ambien, Lunesta, Hydroxyzine, Clonidine, Restoril, and Trazadone. We can try a 50mg  of Seroquel.

## 2022-11-29 NOTE — Telephone Encounter (Signed)
Rx sent, patient notified

## 2022-11-29 NOTE — Telephone Encounter (Signed)
Patient lvm at 11:29 stating that sleep medication prescribed by RM at visit last week is "not helping. Almira Coaster said that she would prescribe something else if needed. Ph: [phone]

## 2022-11-29 NOTE — Telephone Encounter (Signed)
LVM to RC 

## 2022-12-05 ENCOUNTER — Ambulatory Visit (INDEPENDENT_AMBULATORY_CARE_PROVIDER_SITE_OTHER): Payer: BC Managed Care – PPO | Admitting: Psychiatry

## 2022-12-05 DIAGNOSIS — F411 Generalized anxiety disorder: Secondary | ICD-10-CM | POA: Diagnosis not present

## 2022-12-05 NOTE — Progress Notes (Signed)
[facility] Counselor/Therapist Progress Note  Patient ID: Tiffany Cummings, MRN: [id number],    Date: 12/05/2022  Time Spent: 55 minutes   Treatment Type: Individual Therapy  Reported Symptoms:  anxiety, some depression  Mental Status Exam:  Appearance:   Casual     Behavior:  Appropriate, Sharing, and Motivated  Motor:  Normal  Speech/Language:   Clear and Coherent  Affect:  Anxiety, some depression  Mood:  anxious and depressed  Thought process:  goal directed  Thought content:    Some obsessive thoughts  Sensory/Perceptual disturbances:    WNL  Orientation:  oriented to person, place, time/date, situation, day of week, month of year, year, and stated date of Dec 05, 2022  Attention:  Good  Concentration:  Good  Memory:  WNL  Fund of knowledge:   Good  Insight:    Good  Judgment:   Good  Impulse Control:  Good   Risk Assessment: Danger to Self:  No Self-injurious Behavior: No Danger to Others: No Duty to Warn:no Physical Aggression / Violence:No  Access to Firearms a concern: No  Gang Involvement:No   Subjective:   Patient in today reporting anxiety, frustration, and some depression especially related to some ongoing personal stressors, and the living situation with her Mom who is a Chartered loss adjuster and this is very difficult for patient to tolerate. Mom frequently "tells me what to do" and this is hard for patient to accept. States today that things are still very stressed with her mom and verbal tensions between the 2 of them. 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 More focus on stress management and her own self-care.  Interventions: Cognitive Behavioral Therapy and Ego-Supportive  Reduce overall level, frequency, and intensity of the anxiety and some depression, so that  daily functioning is not impaired. 2.  Identify major life conflicts from the past and present that form the basis for present anxiety and some depression. 3.  Reinforced patient's insights into the role of her past emotional pain and present anxiety/depression.  Have patient develop and process a list of keep past and present life conflicts continue to create worry. 4.  Develop behavioral and cognitive strategies to reduce/eliminate irrational anxiety.  Diagnosis:   ICD-10-CM   1. Generalized anxiety disorder  F41.1      Plan:  Patient today participating well in session continuing work on her anxiety and stress management/OCD at work and home.  Was very active today in session and working diligently on her anxiety, difficulty living at home with her mother, frustration, some depression but she tends to minimize it, and also addressed some of her stress management/OCD in the workplace and at home.  Is making some progress and needs to continue working with goal-directed behaviors in order to move into forward direction. Encourage patient in her practice of more consistent positive behaviors including: Stay in the present and focused on what she can change or control, recognize and emphasize her strengths, look for more positives versus negatives daily, stay in touch with supportive people, healthy nutrition and exercise, practice consistent positive self talk, reduce overthinking and over analyzing, challenging counteract self-doubt, interrupt negative/anxious thoughts and challenge them with more reality based thoughts, saying no without feeling guilty, reflect on progress made, be proactive in addressing obstacles/challenges, and realize  the strength she shows working with goal-directed behaviors to move in a direction that supports increased confidence, improved emotional health, and overall wellbeing.  Goal review and progress/challenges noted with patient.  Next appointment within 2 to 3  weeks.   Mathis Fare, LCSW

## 2022-12-09 ENCOUNTER — Other Ambulatory Visit: Payer: Self-pay | Admitting: Adult Health

## 2022-12-09 DIAGNOSIS — F331 Major depressive disorder, recurrent, moderate: Secondary | ICD-10-CM

## 2022-12-12 ENCOUNTER — Other Ambulatory Visit: Payer: Self-pay | Admitting: Adult Health

## 2022-12-12 DIAGNOSIS — G4700 Insomnia, unspecified: Secondary | ICD-10-CM

## 2022-12-18 ENCOUNTER — Ambulatory Visit (INDEPENDENT_AMBULATORY_CARE_PROVIDER_SITE_OTHER): Payer: Self-pay | Admitting: Psychiatry

## 2022-12-18 DIAGNOSIS — F411 Generalized anxiety disorder: Secondary | ICD-10-CM

## 2022-12-18 NOTE — Progress Notes (Signed)
   Patient made late cancellation for appointment today.

## 2022-12-19 ENCOUNTER — Encounter: Payer: Self-pay | Admitting: Adult Health

## 2022-12-19 ENCOUNTER — Ambulatory Visit (INDEPENDENT_AMBULATORY_CARE_PROVIDER_SITE_OTHER): Payer: BC Managed Care – PPO | Admitting: Adult Health

## 2022-12-19 DIAGNOSIS — G4700 Insomnia, unspecified: Secondary | ICD-10-CM

## 2022-12-19 DIAGNOSIS — F411 Generalized anxiety disorder: Secondary | ICD-10-CM | POA: Diagnosis not present

## 2022-12-19 DIAGNOSIS — F422 Mixed obsessional thoughts and acts: Secondary | ICD-10-CM | POA: Diagnosis not present

## 2022-12-19 DIAGNOSIS — F331 Major depressive disorder, recurrent, moderate: Secondary | ICD-10-CM

## 2022-12-19 NOTE — Progress Notes (Signed)
Tiffany Cummings [id number] [birth year] 37 y.o.  Subjective:   Patient ID:  Tiffany Cummings is a 37 y.o. (DOB 07-Jan-1986) female.  Chief Complaint: No chief complaint on file.   HPI ZADY READER presents to the office today for follow-up of MDD, GAD, obsessional thoughts, and insomnia.  Describes mood today as "ok". Denies tearfulness. Pleasant. Mood symptoms - reports anxiety and irritability. Denies depression. Reports some worry, rumination, and over thinking. Reports obsessive thoughts and acts. Reports some over spending  Mood is stable. Feels like the Zoloft 150mg  works well for her. Plans to try the Seroquel 50mg  at bedtime. Has been working with a therapist - Rockne Menghini. Varying interest and motivation. Taking medications as prescribed.  Energy levels lower. Active, does not have a regular exercise routine. Walking daily. Enjoys some usual interests and activities. Single. Not dating. Lives with mother - 2 cats and 2 dogs. Spending time with family and friends. Appetite adequate. Weight stable - 165 pounds - stopped [employer]. Reports sleeping difficulties. Averages 4.5 hours of broken sleep - Trazadone was ineffective. Focus and concentration difficulties - diagnosed with ADHD. Completing tasks. Managing aspects of household. 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 Has her master degree.  Denies SI or HI.  Denies AH or VH. Denies self harm.  Denies substance use.   Previous medication trials: Zoloft, Ativan - crying all the time, Ambien, Lunesta, Hydroxyzine - bad dreams, Adderall XR, Adderall IR, Clonidine, Restoril, Trazadone, Belsomra, Elavil,     Review of Systems:  Review of Systems  Musculoskeletal:  Negative for gait problem.  Neurological:  Negative for tremors.  Psychiatric/Behavioral:         Please refer to HPI    Medications: I have reviewed the patient's current medications.  Current Outpatient Medications  Medication Sig Dispense Refill    amoxicillin-clavulanate (AUGMENTIN) 875-125 MG tablet Take 1 tablet by mouth every 12 (twelve) hours. 14 tablet 0   buPROPion (WELLBUTRIN XL) 150 MG 24 hr tablet TAKE 1 TABLET BY MOUTH EVERY DAY 90 tablet 0   cloNIDine (CATAPRES) 0.1 MG tablet TAKE 1 TABLET BY MOUTH 2 TIMES DAILY. 180 tablet 0   QUEtiapine (SEROQUEL) 50 MG tablet Take 1 tablet (50 mg total) by mouth at bedtime. 30 tablet 1   Semaglutide-Weight Management (WEGOVY) 0.25 MG/0.5ML SOAJ Inject 0.25 mg into the skin once a week (for four weeks) 28 days 2 mL 0   Semaglutide-Weight Management (WEGOVY) 0.5 MG/0.5ML SOAJ Inject 0.5 mL into the skin once a week (for four weeks) 28 days 2 mL 0   Semaglutide-Weight Management (WEGOVY) 1 MG/0.5ML SOAJ Inject 1 mg into the skin once a week. 2 mL 0   Semaglutide-Weight Management (WEGOVY) 1.7 MG/0.75ML SOAJ Inject 1.7 mg into the skin once a week. 3 mL 0   Semaglutide-Weight Management (WEGOVY) 2.4 MG/0.75ML SOAJ Inject 2.4 mg into the skin once a week. 3 mL 0   temazepam (RESTORIL) 30 MG capsule Take 1 capsule (30 mg total) by mouth at bedtime as needed for sleep. 30 capsule 0   traZODone (DESYREL) 50 MG tablet TAKE 1-2 TABLETS BY MOUTH EVERY DAY AT BEDTIME. 180 tablet 1   No current facility-administered medications for this visit.    Medication Side Effects: None  Allergies:  Allergies  Allergen Reactions   Flagyl [Metronidazole] Nausea And Vomiting   Ceclor [Cefaclor] Rash   Septra [Sulfamethoxazole-Trimethoprim] Rash    No past medical history on file.  Past Medical History, Surgical  history, Social history, and Family history were reviewed and updated as appropriate.   Please see review of systems for further details on the patient's review from today.   Objective:   Physical Exam:  There were no vitals taken for this visit.  Physical Exam Constitutional:      General: She is not in acute distress. Musculoskeletal:        General: No deformity.  Neurological:      Mental Status: She is alert and oriented to person, place, and time.     Coordination: Coordination normal.  Psychiatric:        Attention and Perception: Attention and perception normal. She does not perceive auditory or visual hallucinations.        Mood and Affect: Mood normal. Mood is not anxious or depressed. Affect is not labile, blunt, angry or inappropriate.        Speech: Speech normal.        Behavior: Behavior normal.        Thought Content: Thought content normal. Thought content is not paranoid or delusional. Thought content does not include homicidal or suicidal ideation. Thought content does not include homicidal or suicidal plan.        Cognition and Memory: Cognition and memory normal.        Judgment: Judgment normal.     Comments: Insight intact     Lab Review:  No results found for: "NA", "K", "CL", "CO2", "GLUCOSE", "BUN", "CREATININE", "CALCIUM", "PROT", "ALBUMIN", "AST", "ALT", "ALKPHOS", "BILITOT", "GFRNONAA", "GFRAA"  No results found for: "WBC", "RBC", "HGB", "HCT", "PLT", "MCV", "MCH", "MCHC", "RDW", "LYMPHSABS", "MONOABS", "EOSABS", "BASOSABS"  No results found for: "POCLITH", "LITHIUM"   No results found for: "PHENYTOIN", "PHENOBARB", "VALPROATE", "CBMZ"   .res Assessment: Plan:    Plan:  PDMP reviewed  Zoloft 150mg  daily - PCP prescribes  D/C Trazadone 50mg  hs  Consider Ritalin next visit  Consider: Clonazepam Seroquel  NAC tabs daily  Lab results from [facility] available - will have information sent over  PCP at [facility].   Therapist - previously seen by Nehemiah Settle testing - results given.   RTC 4 weeks  Patient advised to contact office with any questions, adverse effects, or acute worsening in signs and symptoms.  Diagnoses and all orders for this visit:  Major depressive disorder, recurrent episode, moderate (HCC)  Generalized anxiety disorder  Insomnia, unspecified  type  Mixed obsessional thoughts and acts     Please see After Visit Summary for patient specific instructions.  Future Appointments  Date Time Provider Department Center  01/01/2023 11:00 AM Mathis Fare, LCSW [facility] None  01/27/2023 12:00 PM Mathis Fare, LCSW [facility] None    No orders of the defined types were placed in this encounter.   -------------------------------

## 2023-01-01 ENCOUNTER — Ambulatory Visit: Payer: BC Managed Care – PPO | Admitting: Psychiatry

## 2023-01-01 DIAGNOSIS — F411 Generalized anxiety disorder: Secondary | ICD-10-CM

## 2023-01-01 NOTE — Progress Notes (Signed)
[facility] Counselor/Therapist Progress Note  Patient ID: Tiffany Cummings, MRN: [id number],    Date: 01/01/2023  Time Spent: 45 minutes   Treatment Type: Individual Therapy  Reported Symptoms: anxiety, frustration  Mental Status Exam:  Appearance:   Casual     Behavior:  Appropriate, Sharing, and Motivated  Motor:  Normal  Speech/Language:   Clear and Coherent  Affect:  anxious  Mood:  anxious  Thought process:  goal directed  Thought content:    Rumination and some obsessive thoughts  Sensory/Perceptual disturbances:    WNL  Orientation:  oriented to person, place, time/date, situation, day of week, month of year, year, and stated date of January 01, 2023  Attention:  Good  Concentration:  Good  Memory:  WNL  Fund of knowledge:   Good  Insight:    Good  Judgment:   Good  Impulse Control:  Good   Risk Assessment: Danger to Self:  No Self-injurious Behavior: No Danger to Others: No Duty to Warn:no Physical Aggression / Violence:No  Access to Firearms a concern: No  Gang Involvement:No   Subjective:   Patient in today for session and reporting anxiety, frustration, overspending, some depression, and very worried about a very personal issues which she talks in session about but wants to remain confidential. 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  Worked with patient on prioritizing some of her needs and issues, and looking at "next steps" she needs to take within confidential family issue and her own individual issues she is facing.  Is motivated but also experiencing some anger with herself which we worked with today in session.  Tension between patient and mother continues to exist as mom continues to often tell patient what to do and patient resents this.  Looking at some healthier boundaries between she and mother.  Did follow-up on previous issue (as discussed last  session) at her previous job before she resigned.  Continue to focus and strategies about stress management as patient is dealing with a lot of added stress more recently.    Interventions: Cognitive Behavioral Therapy and Ego-Supportive  Reduce overall level, frequency, and intensity of the anxiety and some depression, so that daily functioning is not impaired. 2.  Identify major life conflicts from the past and present that form the basis for present anxiety and some depression. 3.  Reinforced patient's insights into the role of her past emotional pain and present anxiety/depression.  Have patient develop and process a list of keep past and present life conflicts continue to create worry. 4.  Develop behavioral and cognitive strategies to reduce/eliminate irrational anxiety.  Diagnosis:   ICD-10-CM   1. Generalized anxiety disorder  F41.1      Plan:   Patient in session today and continuing her work on anxiety, OCD, and stress management that she reports plays out both at home and at work.  More concerned today about a specific confidential issue that has just arisen recently and we focused more heavily on it today.  Patient has made progress and needs to continue working with goal-directed behaviors to move in a forward direction. Encouraged patient in her practice of more consistent positive behaviors including: Staying in the present and focus on what she can change or control, recognize and emphasize her strengths, look for more positives versus negatives daily, staying in touch with supportive people, healthy nutrition and exercise,  practice consistent positive self talk, reduce overthinking and over analyzing, challenge and counteract her self-doubt, interrupt negative/anxious thoughts and challenge them with more reality based thoughts, saying no without feeling guilty, reflect on progress made, be proactive in addressing obstacles/challenges, and recognize the strengths she shows working with  goal-directed behaviors to move in a direction that supports her increased confidence, improved emotional health, and outlook.  Goal review and progress/challenges noted with patient.  Next appointment within 2 to 3 weeks.   Mathis Fare, LCSW

## 2023-01-13 ENCOUNTER — Ambulatory Visit: Payer: BC Managed Care – PPO | Admitting: Adult Health

## 2023-01-13 ENCOUNTER — Encounter: Payer: Self-pay | Admitting: Adult Health

## 2023-01-13 DIAGNOSIS — F422 Mixed obsessional thoughts and acts: Secondary | ICD-10-CM | POA: Diagnosis not present

## 2023-01-13 DIAGNOSIS — G4700 Insomnia, unspecified: Secondary | ICD-10-CM

## 2023-01-13 DIAGNOSIS — F331 Major depressive disorder, recurrent, moderate: Secondary | ICD-10-CM | POA: Diagnosis not present

## 2023-01-13 DIAGNOSIS — F411 Generalized anxiety disorder: Secondary | ICD-10-CM

## 2023-01-13 MED ORDER — CLONAZEPAM 0.5 MG PO TABS: ORAL_TABLET | ORAL | 0 refills | Status: DC

## 2023-01-13 NOTE — Progress Notes (Signed)
Tiffany Cummings [id number] 1985/12/04 36 y.o.  Subjective:   Patient ID:  Tiffany Cummings is a 37 y.o. (DOB [birth year]) female.  Chief Complaint: No chief complaint on file.   HPI Tiffany Cummings presents to the office today for follow-up of MDD, GAD, obsessional thoughts, and insomnia.  Describes mood today as "ok". Denies tearfulness. Pleasant. Mood symptoms - denies depression. Reports anxiety. Reports irritability - when around her mother. Denies panic attacks. Reports some worry, rumination, and over thinking. Reports obsessive thoughts and acts. Reports some over spending - "but better". Has identified she has a problem with over spending and is doing better. Mood is stable. Feels like the Zoloft 150mg  works well for her. Did not try the Seroquel - "afraid of it" - still willing to consider other options. Has been working with a therapist - Rockne Menghini. Varying interest and motivation. Taking medications as prescribed.  Energy levels lower. Active, does not have a regular exercise routine. Walking daily. Enjoys some usual interests and activities. Single. Not dating. Lives with mother - 2 cats and 2 dogs. Spending time with family and friends. Appetite adequate. Weight stable - 165 pounds. Reports sleeping difficulties. Averages 4.5 hours of broken sleep. Focus and concentration difficulties - diagnosed with ADHD. Completing tasks. Managing aspects of household. 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 Has her master degree.  Denies SI or HI.  Denies AH or VH. Denies self harm.  Denies substance use.   Previous medication trials: Zoloft, Ativan - crying all the time, Ambien, Lunesta, Hydroxyzine - bad dreams, Adderall XR, Adderall IR, Clonidine, Restoril, Trazadone, Belsomra, Elavil,   Review of Systems:  Review of Systems  Musculoskeletal:  Negative for gait problem.  Neurological:  Negative for tremors.  Psychiatric/Behavioral:         Please  refer to HPI    Medications: I have reviewed the patient's current medications.  Current Outpatient Medications  Medication Sig Dispense Refill   amoxicillin-clavulanate (AUGMENTIN) 875-125 MG tablet Take 1 tablet by mouth every 12 (twelve) hours. 14 tablet 0   buPROPion (WELLBUTRIN XL) 150 MG 24 hr tablet TAKE 1 TABLET BY MOUTH EVERY DAY 90 tablet 0   cloNIDine (CATAPRES) 0.1 MG tablet TAKE 1 TABLET BY MOUTH 2 TIMES DAILY. 180 tablet 0   QUEtiapine (SEROQUEL) 50 MG tablet Take 1 tablet (50 mg total) by mouth at bedtime. 30 tablet 1   Semaglutide-Weight Management (WEGOVY) 0.25 MG/0.5ML SOAJ Inject 0.25 mg into the skin once a week (for four weeks) 28 days 2 mL 0   Semaglutide-Weight Management (WEGOVY) 0.5 MG/0.5ML SOAJ Inject 0.5 mL into the skin once a week (for four weeks) 28 days 2 mL 0   Semaglutide-Weight Management (WEGOVY) 1 MG/0.5ML SOAJ Inject 1 mg into the skin once a week. 2 mL 0   Semaglutide-Weight Management (WEGOVY) 1.7 MG/0.75ML SOAJ Inject 1.7 mg into the skin once a week. 3 mL 0   Semaglutide-Weight Management (WEGOVY) 2.4 MG/0.75ML SOAJ Inject 2.4 mg into the skin once a week. 3 mL 0   temazepam (RESTORIL) 30 MG capsule Take 1 capsule (30 mg total) by mouth at bedtime as needed for sleep. 30 capsule 0   traZODone (DESYREL) 50 MG tablet TAKE 1-2 TABLETS BY MOUTH EVERY DAY AT BEDTIME. 180 tablet 1   No current facility-administered medications for this visit.    Medication Side Effects: None  Allergies:  Allergies  Allergen Reactions   Flagyl [Metronidazole]  Nausea And Vomiting   Ceclor [Cefaclor] Rash   Septra [Sulfamethoxazole-Trimethoprim] Rash    No past medical history on file.  Past Medical History, Surgical history, Social history, and Family history were reviewed and updated as appropriate.   Please see review of systems for further details on the patient's review from today.   Objective:   Physical Exam:  There were no vitals taken for this  visit.  Physical Exam Neurological:     Mental Status: She is alert and oriented to person, place, and time.  Psychiatric:        Attention and Perception: Attention and perception normal. She does not perceive auditory or visual hallucinations.        Mood and Affect: Mood normal. Mood is not anxious or depressed. Affect is not labile, blunt, angry or inappropriate.        Speech: Speech normal.        Behavior: Behavior normal.        Thought Content: Thought content normal. Thought content is not paranoid or delusional. Thought content does not include homicidal or suicidal ideation. Thought content does not include homicidal or suicidal plan.        Cognition and Memory: Cognition and memory normal.        Judgment: Judgment normal.     Comments: Insight intact     Lab Review:  No results found for: "NA", "K", "CL", "CO2", "GLUCOSE", "BUN", "CREATININE", "CALCIUM", "PROT", "ALBUMIN", "AST", "ALT", "ALKPHOS", "BILITOT", "GFRNONAA", "GFRAA"  No results found for: "WBC", "RBC", "HGB", "HCT", "PLT", "MCV", "MCH", "MCHC", "RDW", "LYMPHSABS", "MONOABS", "EOSABS", "BASOSABS"  No results found for: "POCLITH", "LITHIUM"   No results found for: "PHENYTOIN", "PHENOBARB", "VALPROATE", "CBMZ"   .res Assessment: Plan:     Plan:  PDMP reviewed  Zoloft 150mg  daily - PCP prescribes  D/C Seroquel 50mg  at hs Add Clonazepam 0.5mg  - 1 to 2 tablets at bedtime.  Consider Ritalin next visit  NAC tabs daily  Lab results from [facility] available - will have information sent over.  PCP at [facility].   Therapist - previously seen by Nehemiah Settle testing - results given.   RTC 4 weeks  Patient advised to contact office with any questions, adverse effects, or acute worsening in signs and symptoms.  There are no diagnoses linked to this encounter.   Please see After Visit Summary for patient specific instructions.  Future Appointments  Date  Time Provider Department Center  01/13/2023 11:40 AM Matilde Markie, Thereasa Solo, NP [facility] None  01/27/2023 12:00 PM Mathis Fare, LCSW [facility] None    No orders of the defined types were placed in this encounter.   -------------------------------

## 2023-01-27 ENCOUNTER — Ambulatory Visit: Payer: BC Managed Care – PPO | Admitting: Psychiatry

## 2023-01-31 ENCOUNTER — Other Ambulatory Visit: Payer: Self-pay

## 2023-01-31 ENCOUNTER — Telehealth: Payer: Self-pay | Admitting: Adult Health

## 2023-01-31 MED ORDER — ZOLPIDEM TARTRATE 5 MG PO TABS: 5.0000 mg | ORAL_TABLET | Freq: Every evening | ORAL | 0 refills | Status: DC | PRN

## 2023-01-31 NOTE — Telephone Encounter (Signed)
Next visit is 02/11/23. Tiffany Cummings is currently in [place], [place] visiting family. She was put on Klonopin for sleep. Since she has been on it she only gets 2-3 hours a night to sleep and it makes her more restless. She is requesting an RX  for Ambien for two weeks so she will be able to sleep while up there. Pharmacy is:  [facility], [address] [place], [place]  [address]. RX needs to be called to [phone].

## 2023-01-31 NOTE — Telephone Encounter (Signed)
Pended.

## 2023-01-31 NOTE — Telephone Encounter (Signed)
LVM to RC 

## 2023-01-31 NOTE — Telephone Encounter (Signed)
Patient prescribed clonazepam for sleep, is the only medication you prescribe. She reports taking 1 and 2 tablets and it is not helping with sleep, she is just restless. She is currently in [place] and asking if you will Rx her a 2-week supply of Ambien.  I called pharmacy to be sure they would accept an out of state controlled Rx and they will.  I will pend if you are ok with it.

## 2023-01-31 NOTE — Telephone Encounter (Signed)
That is fine 

## 2023-02-11 ENCOUNTER — Telehealth: Payer: Self-pay | Admitting: Adult Health

## 2023-02-11 ENCOUNTER — Ambulatory Visit: Payer: BC Managed Care – PPO | Admitting: Adult Health

## 2023-02-11 ENCOUNTER — Encounter: Payer: Self-pay | Admitting: Adult Health

## 2023-02-11 DIAGNOSIS — F331 Major depressive disorder, recurrent, moderate: Secondary | ICD-10-CM | POA: Diagnosis not present

## 2023-02-11 DIAGNOSIS — F902 Attention-deficit hyperactivity disorder, combined type: Secondary | ICD-10-CM

## 2023-02-11 DIAGNOSIS — G4700 Insomnia, unspecified: Secondary | ICD-10-CM | POA: Diagnosis not present

## 2023-02-11 DIAGNOSIS — F411 Generalized anxiety disorder: Secondary | ICD-10-CM

## 2023-02-11 DIAGNOSIS — F422 Mixed obsessional thoughts and acts: Secondary | ICD-10-CM

## 2023-02-11 MED ORDER — METHYLPHENIDATE HCL 10 MG PO TABS: 10.0000 mg | ORAL_TABLET | Freq: Every day | ORAL | 0 refills | Status: DC

## 2023-02-11 MED ORDER — CLONIDINE HCL 0.3 MG PO TABS: 0.3000 mg | ORAL_TABLET | Freq: Every day | ORAL | 2 refills | Status: DC

## 2023-02-11 NOTE — Progress Notes (Signed)
Tiffany Cummings [id number] 1986/07/08 37 y.o.  Subjective:   Patient ID:  Tiffany Cummings is a 37 y.o. (DOB July 10, 1986) female.  Chief Complaint: No chief complaint on file.   HPI FREYA ZOBRIST presents to the office today for follow-up of  MDD, GAD, obsessional thoughts, and insomnia.  Describes mood today as "ok". Denies tearfulness. Pleasant. Mood symptoms - denies depression. Reports anxiety. Reports one recent anxiety attack. Reports irritability - "related to mother". Reports worry, rumination, and over thinking. Reports obsessive thoughts and acts - hand washing. Reports decreased over spending - "not so much any more". Mood is stable. Feels like the Zoloft 150mg  works well for her. Has recently tried Ambien and did not find it helpful. Has been working with a therapist - Rockne Menghini. Varying interest and motivation. Taking medications as prescribed.  Energy levels lower. Active, does not have a regular exercise routine. Walking daily. Enjoys some usual interests and activities. Single. Not dating. Lives with mother - 2 cats and 2 dogs. Spending time with family and friends. Appetite adequate. Weight stable - 165 pounds. Reports sleeping difficulties. Tried 0.3mg  of Clonidine for the past 2 nights and slept well . Typically averages 5 hours of broken sleep. Reports some daytime napping a few days a week. Focus and concentration difficulties - diagnosed with ADHD. Completing tasks. Managing aspects of household. 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 Has her master degree.  Denies SI or HI.  Denies AH or VH. Denies self harm.  Denies substance use.   Previous medication trials: Zoloft, Ativan - crying all the time, Ambien, Lunesta, Hydroxyzine - bad dreams, Adderall XR, Adderall IR, Clonidine, Restoril, Trazadone, Belsomra, Elavil, Ambien, Clonazepam.      Review of Systems:  Review of Systems  Musculoskeletal:  Negative for gait problem.   Neurological:  Negative for tremors.  Psychiatric/Behavioral:         Please refer to HPI    Medications: I have reviewed the patient's current medications.  Current Outpatient Medications  Medication Sig Dispense Refill   cloNIDine (CATAPRES) 0.3 MG tablet Take 1 tablet (0.3 mg total) by mouth at bedtime. 30 tablet 2   methylphenidate (RITALIN) 10 MG tablet Take 1 tablet (10 mg total) by mouth daily. 30 tablet 0   clonazePAM (KLONOPIN) 0.5 MG tablet Take one to two tablets at bedtime. 60 tablet 0   zolpidem (AMBIEN) 5 MG tablet Take 1 tablet (5 mg total) by mouth at bedtime as needed for sleep. 14 tablet 0   No current facility-administered medications for this visit.    Medication Side Effects: None  Allergies:  Allergies  Allergen Reactions   Flagyl [Metronidazole] Nausea And Vomiting   Ceclor [Cefaclor] Rash   Septra [Sulfamethoxazole-Trimethoprim] Rash    No past medical history on file.  Past Medical History, Surgical history, Social history, and Family history were reviewed and updated as appropriate.   Please see review of systems for further details on the patient's review from today.   Objective:   Physical Exam:  There were no vitals taken for this visit.  Physical Exam Constitutional:      General: She is not in acute distress. Musculoskeletal:        General: No deformity.  Neurological:     Mental Status: She is alert and oriented to person, place, and time.     Coordination: Coordination normal.  Psychiatric:        Attention and  Perception: Attention and perception normal. She does not perceive auditory or visual hallucinations.        Mood and Affect: Affect is not labile, blunt, angry or inappropriate.        Speech: Speech normal.        Behavior: Behavior normal.        Thought Content: Thought content normal. Thought content is not paranoid or delusional. Thought content does not include homicidal or suicidal ideation. Thought content does not  include homicidal or suicidal plan.        Cognition and Memory: Cognition and memory normal.        Judgment: Judgment normal.     Comments: Insight intact     Lab Review:  No results found for: "NA", "K", "CL", "CO2", "GLUCOSE", "BUN", "CREATININE", "CALCIUM", "PROT", "ALBUMIN", "AST", "ALT", "ALKPHOS", "BILITOT", "GFRNONAA", "GFRAA"  No results found for: "WBC", "RBC", "HGB", "HCT", "PLT", "MCV", "MCH", "MCHC", "RDW", "LYMPHSABS", "MONOABS", "EOSABS", "BASOSABS"  No results found for: "POCLITH", "LITHIUM"   No results found for: "PHENYTOIN", "PHENOBARB", "VALPROATE", "CBMZ"   .res Assessment: Plan:    Plan:  PDMP reviewed  Zoloft 150mg  daily - PCP prescribes  Add Clonidine 0.3mg  at hs for sleep  D/C Clonazepam - ineffective  Add Ritalin 10mg  next visit  NAC tabs daily  Lab results from [facility] available - will have information sent over.  PCP at [facility].   Therapist - previously seen by Nehemiah Settle testing - results given.   RTC 4 weeks  Patient advised to contact office with any questions, adverse effects, or acute worsening in signs and symptoms. Diagnoses and all orders for this visit:  Attention deficit hyperactivity disorder (ADHD), combined type -     methylphenidate (RITALIN) 10 MG tablet; Take 1 tablet (10 mg total) by mouth daily.  Major depressive disorder, recurrent episode, moderate (HCC)  Insomnia, unspecified type -     cloNIDine (CATAPRES) 0.3 MG tablet; Take 1 tablet (0.3 mg total) by mouth at bedtime.  Generalized anxiety disorder  Mixed obsessional thoughts and acts     Please see After Visit Summary for patient specific instructions.  No future appointments.   No orders of the defined types were placed in this encounter.   -------------------------------

## 2023-02-11 NOTE — Telephone Encounter (Signed)
Pharmacy sent PA Request for Methylphenidate 10mg  tabs, see CMM

## 2023-02-12 NOTE — Telephone Encounter (Signed)
Prior Authorization Ritalin 10 mg #30/30 [employer]

## 2023-02-13 NOTE — Telephone Encounter (Signed)
Approval received effective through 02/10/2026 with [employer] for Methylphenidate 10 mg

## 2023-03-05 ENCOUNTER — Ambulatory Visit (INDEPENDENT_AMBULATORY_CARE_PROVIDER_SITE_OTHER): Payer: Self-pay | Admitting: Adult Health

## 2023-03-05 DIAGNOSIS — Z0389 Encounter for observation for other suspected diseases and conditions ruled out: Secondary | ICD-10-CM

## 2023-03-05 NOTE — Progress Notes (Signed)
Patient no show appointment. ? ?

## 2023-05-04 ENCOUNTER — Other Ambulatory Visit: Payer: Self-pay | Admitting: Adult Health

## 2023-05-04 DIAGNOSIS — G4700 Insomnia, unspecified: Secondary | ICD-10-CM

## 2023-05-08 ENCOUNTER — Encounter: Payer: Self-pay | Admitting: Adult Health

## 2023-06-05 ENCOUNTER — Ambulatory Visit: Payer: BC Managed Care – PPO | Admitting: Adult Health

## 2023-06-05 ENCOUNTER — Encounter: Payer: Self-pay | Admitting: Adult Health

## 2023-06-05 DIAGNOSIS — F411 Generalized anxiety disorder: Secondary | ICD-10-CM

## 2023-06-05 DIAGNOSIS — G4700 Insomnia, unspecified: Secondary | ICD-10-CM | POA: Diagnosis not present

## 2023-06-05 DIAGNOSIS — F331 Major depressive disorder, recurrent, moderate: Secondary | ICD-10-CM | POA: Diagnosis not present

## 2023-06-05 DIAGNOSIS — F422 Mixed obsessional thoughts and acts: Secondary | ICD-10-CM

## 2023-06-05 DIAGNOSIS — F902 Attention-deficit hyperactivity disorder, combined type: Secondary | ICD-10-CM

## 2023-06-05 MED ORDER — CLONIDINE HCL 0.1 MG PO TABS: 0.1000 mg | ORAL_TABLET | Freq: Every day | ORAL | 0 refills | Status: DC

## 2023-06-05 MED ORDER — AMPHETAMINE-DEXTROAMPHETAMINE 20 MG PO TABS: 20.0000 mg | ORAL_TABLET | Freq: Every day | ORAL | 0 refills | Status: DC

## 2023-06-05 MED ORDER — ALPRAZOLAM 0.5 MG PO TABS: 0.5000 mg | ORAL_TABLET | Freq: Every evening | ORAL | 0 refills | Status: DC | PRN

## 2023-06-05 NOTE — Progress Notes (Signed)
Tiffany Cummings [id number] 1985/10/24 37 y.o.  Subjective:   Patient ID:  Tiffany Cummings is a 37 y.o. (DOB Dec 06, 1985) female.  Chief Complaint: No chief complaint on file.   HPI LIRIDONA BULTEMEIER presents to the office today for follow-up of  MDD, GAD, obsessional thoughts, and insomnia.  Describes mood today as "ok". Pleasant. Denies tearfulness. Mood symptoms - denies depression. Reports anxiety and irritability. Reports one recent anxiety attack. Reports worry, rumination, and over thinking. Reports obsessive thoughts and acts. Reports decreased over spending - "not spending as much". Mood is stable. Feels like the Zoloft 150mg  works well for her. Does not feel like the Clonidine is working as well for sleep and would like to consider other options. Has been working with a therapist - Rockne Menghini. Varying interest and motivation. Taking medications as prescribed.  Energy levels lower. Active, does not have a regular exercise routine. Walking daily. Enjoys some usual interests and activities. Single. Not dating. Lives with mother - 2 cats and 2 dogs. Spending time with family and friends. Appetite adequate. Weight stable - 165 pounds. Reports sleeping difficulties. Averages 4 hours of broken sleep.  Reports focus and concentration stable. Completing tasks. Managing aspects of household. 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  Denies AH or VH. Denies self harm.  Denies substance use.   Previous medication trials: Zoloft, Ativan - crying all the time, Ambien, Lunesta, Hydroxyzine - bad dreams, Adderall XR, Adderall IR, Clonidine, Restoril, Trazadone, Belsomra, Elavil, Ambien, Clonazepam.   Review of Systems:  Review of Systems  Musculoskeletal:  Negative for gait problem.  Neurological:  Negative for tremors.  Psychiatric/Behavioral:         Please refer to HPI    Medications: I have reviewed the patient's current medications.  Current Outpatient Medications  Medication  Sig Dispense Refill   clonazePAM (KLONOPIN) 0.5 MG tablet Take one to two tablets at bedtime. 60 tablet 0   cloNIDine (CATAPRES) 0.3 MG tablet TAKE 1 TABLET (0.3 MG TOTAL) BY MOUTH AT BEDTIME. 30 tablet 0   methylphenidate (RITALIN) 10 MG tablet Take 1 tablet (10 mg total) by mouth daily. 30 tablet 0   zolpidem (AMBIEN) 5 MG tablet Take 1 tablet (5 mg total) by mouth at bedtime as needed for sleep. 14 tablet 0   No current facility-administered medications for this visit.    Medication Side Effects: None  Allergies:  Allergies  Allergen Reactions   Flagyl [Metronidazole] Nausea And Vomiting   Ceclor [Cefaclor] Rash   Septra [Sulfamethoxazole-Trimethoprim] Rash    No past medical history on file.  Past Medical History, Surgical history, Social history, and Family history were reviewed and updated as appropriate.   Please see review of systems for further details on the patient's review from today.   Objective:   Physical Exam:  There were no vitals taken for this visit.  Physical Exam Constitutional:      General: She is not in acute distress. Musculoskeletal:        General: No deformity.  Neurological:     Mental Status: She is alert and oriented to person, place, and time.     Coordination: Coordination normal.  Psychiatric:        Attention and Perception: Attention and perception normal. She does not perceive auditory or visual hallucinations.        Mood and Affect: Mood normal. Mood is not anxious or depressed. Affect is not labile, blunt, angry or inappropriate.  Speech: Speech normal.        Behavior: Behavior normal.        Thought Content: Thought content normal. Thought content is not paranoid or delusional. Thought content does not include homicidal or suicidal ideation. Thought content does not include homicidal or suicidal plan.        Cognition and Memory: Cognition and memory normal.        Judgment: Judgment normal.     Comments: Insight intact      Lab Review:  No results found for: "NA", "K", "CL", "CO2", "GLUCOSE", "BUN", "CREATININE", "CALCIUM", "PROT", "ALBUMIN", "AST", "ALT", "ALKPHOS", "BILITOT", "GFRNONAA", "GFRAA"  No results found for: "WBC", "RBC", "HGB", "HCT", "PLT", "MCV", "MCH", "MCHC", "RDW", "LYMPHSABS", "MONOABS", "EOSABS", "BASOSABS"  No results found for: "POCLITH", "LITHIUM"   No results found for: "PHENYTOIN", "PHENOBARB", "VALPROATE", "CBMZ"   .res Assessment: Plan:    Plan:  PDMP reviewed  Zoloft 150mg  daily - PCP prescribes  Add Clonidine 0.3mg  at hs for sleep - discussed taper.  Add Adderall 20mg  daily   NAC tabs daily  Lab results from [facility] available - will have information sent over.  PC[facility] at [facility].   Therapist - previously seen by Nehemiah Settle testing - results given.   RTC 4 weeks  Patient advised to contact office with any questions, adverse effects, or acute worsening in signs and symptoms.  There are no diagnoses linked to this encounter.   Please see After Visit Summary for patient specific instructions.  Future Appointments  Date Time Provider Department Center  06/05/2023  3:40 PM Dakotah Orrego, Thereasa Solo, NP CP-CP None    No orders of the defined types were placed in this encounter.   -------------------------------

## 2023-06-07 ENCOUNTER — Other Ambulatory Visit: Payer: Self-pay | Admitting: Adult Health

## 2023-06-07 DIAGNOSIS — G4700 Insomnia, unspecified: Secondary | ICD-10-CM

## 2023-06-24 ENCOUNTER — Telehealth: Payer: Self-pay | Admitting: Adult Health

## 2023-06-24 DIAGNOSIS — G4700 Insomnia, unspecified: Secondary | ICD-10-CM

## 2023-06-24 MED ORDER — CLONIDINE HCL 0.1 MG PO TABS: 0.1000 mg | ORAL_TABLET | Freq: Every day | ORAL | 0 refills | Status: DC

## 2023-06-24 NOTE — Telephone Encounter (Signed)
Pt lvm that the script for clonidine was wrong. She is tapering off . Please call her at [phone]

## 2023-06-24 NOTE — Telephone Encounter (Signed)
Patient reports insurance wouldn't cover because it is too early to fill. She was using two 0.1 mg to taper down to 0.2. She is asking for Rx for 14 day fill of 0.1 mg to finish her taper and insurance may cover. Told her it was less than $10 using [facility].

## 2023-07-01 ENCOUNTER — Ambulatory Visit: Payer: BC Managed Care – PPO | Admitting: Adult Health

## 2023-07-01 ENCOUNTER — Encounter: Payer: Self-pay | Admitting: Adult Health

## 2023-07-01 DIAGNOSIS — F411 Generalized anxiety disorder: Secondary | ICD-10-CM | POA: Diagnosis not present

## 2023-07-01 DIAGNOSIS — G4700 Insomnia, unspecified: Secondary | ICD-10-CM

## 2023-07-01 DIAGNOSIS — F902 Attention-deficit hyperactivity disorder, combined type: Secondary | ICD-10-CM

## 2023-07-01 DIAGNOSIS — F331 Major depressive disorder, recurrent, moderate: Secondary | ICD-10-CM

## 2023-07-01 MED ORDER — ALPRAZOLAM 0.5 MG PO TABS: 0.5000 mg | ORAL_TABLET | Freq: Every evening | ORAL | 0 refills | Status: DC | PRN

## 2023-07-01 MED ORDER — AMPHETAMINE-DEXTROAMPHETAMINE 20 MG PO TABS: 20.0000 mg | ORAL_TABLET | Freq: Every day | ORAL | 0 refills | Status: DC

## 2023-07-01 MED ORDER — ZOLPIDEM TARTRATE 5 MG PO TABS: 5.0000 mg | ORAL_TABLET | Freq: Every evening | ORAL | 0 refills | Status: DC | PRN

## 2023-07-01 NOTE — Progress Notes (Signed)
[name] [id number] [birth year] 37 y.o.  Subjective:   Patient ID:  Tiffany Cummings is a 37 y.o. (DOB 04-24-1986) female.  Chief Complaint: No chief complaint on file.   HPI Tiffany Cummings presents to the office today for follow-up of MDD, GAD, obsessional thoughts, and insomnia.  Describes mood today as "ok". Pleasant. Denies tearfulness. Mood symptoms - denies depression. Reports some situational anxiety and irritability. Reports one recent anxiety attack. Denies panic attacks. Reports worry, rumination, and over thinking. Reports obsessive thoughts and acts. Denies over spending. Mood is stable. Feels like the Zoloft 150mg  works well for her. Continues Clonidine taper - now at 0.1mg  at bedtime. Has been working with a therapist - Rockne Menghini. Varying interest and motivation. Taking medications as prescribed.  Energy levels lower. Active, does not have a regular exercise routine. Walking daily. Enjoys some usual interests and activities. Single. Not dating. Lives with mother - 4 cats and 2 dogs. Spending time with family and friends. Appetite adequate. Weight stable - 165 pounds. Reports ongoing sleeping difficulties. Averages 4 to 5 hours of broken sleep. Reports sleep study in August - normal.  Reports focus and concentration difficulties - trouble getting started on tasks - then gets over stimulated. Completing tasks. Managing aspects of household. 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  Denies AH or VH. Denies self harm.  Denies substance use.   Previous medication trials: Zoloft, Ativan - crying all the time, Ambien, Lunesta, Hydroxyzine - bad dreams, Adderall XR, Adderall IR, Clonidine, Restoril, Trazadone, Belsomra, Elavil, Ambien, Clonazepam.      Review of Systems:  Review of Systems  Musculoskeletal:  Negative for gait problem.  Neurological:  Negative for tremors.  Psychiatric/Behavioral:         Please refer to HPI    Medications: I have reviewed the  patient's current medications.  Current Outpatient Medications  Medication Sig Dispense Refill   ALPRAZolam (XANAX) 0.5 MG tablet Take 1 tablet (0.5 mg total) by mouth at bedtime as needed for anxiety. 30 tablet 0   amphetamine-dextroamphetamine (ADDERALL) 20 MG tablet Take 1 tablet (20 mg total) by mouth daily. 30 tablet 0   cloNIDine (CATAPRES) 0.1 MG tablet Take 1 tablet (0.1 mg total) by mouth daily. 14 tablet 0   sertraline (ZOLOFT) 50 MG tablet Take 200 mg by mouth daily.     No current facility-administered medications for this visit.    Medication Side Effects: None  Allergies:  Allergies  Allergen Reactions   Flagyl [Metronidazole] Nausea And Vomiting   Ceclor [Cefaclor] Rash   Septra [Sulfamethoxazole-Trimethoprim] Rash    No past medical history on file.  Past Medical History, Surgical history, Social history, and Family history were reviewed and updated as appropriate.   Please see review of systems for further details on the patient's review from today.   Objective:   Physical Exam:  There were no vitals taken for this visit.  Physical Exam Constitutional:      General: She is not in acute distress. Musculoskeletal:        General: No deformity.  Neurological:     Mental Status: She is alert and oriented to person, place, and time.     Coordination: Coordination normal.  Psychiatric:        Attention and Perception: Attention and perception normal. She does not perceive auditory or visual hallucinations.        Mood and Affect: Affect is not labile, blunt, angry or inappropriate.  Speech: Speech normal.        Behavior: Behavior normal.        Thought Content: Thought content normal. Thought content is not paranoid or delusional. Thought content does not include homicidal or suicidal ideation. Thought content does not include homicidal or suicidal plan.        Cognition and Memory: Cognition and memory normal.        Judgment: Judgment normal.      Comments: Insight intact     Lab Review:  No results found for: "NA", "K", "CL", "CO2", "GLUCOSE", "BUN", "CREATININE", "CALCIUM", "PROT", "ALBUMIN", "AST", "ALT", "ALKPHOS", "BILITOT", "GFRNONAA", "GFRAA"  No results found for: "WBC", "RBC", "HGB", "HCT", "PLT", "MCV", "MCH", "MCHC", "RDW", "LYMPHSABS", "MONOABS", "EOSABS", "BASOSABS"  No results found for: "POCLITH", "LITHIUM"   No results found for: "PHENYTOIN", "PHENOBARB", "VALPROATE", "CBMZ"   .res Assessment: Plan:    Plan:  PDMP reviewed  Zoloft 150mg  daily - PCP prescribes Adderall 20mg  daily   Clonidine 0.1mg  at hs for 7 days and will d/c - finishing up taper.  Increase Xanax 0.5mg  at hs to 1mg  at hs   NAC tabs daily  Lab results from [facility] available - will have information sent over.  PCP at [facility].   Therapist - previously seen by Nehemiah Settle testing - results given.   RTC 4 weeks  Patient advised to contact office with any questions, adverse effects, or acute worsening in signs and symptoms.  There are no diagnoses linked to this encounter.   Please see After Visit Summary for patient specific instructions.  No future appointments.  No orders of the defined types were placed in this encounter.   -------------------------------

## 2023-07-07 ENCOUNTER — Telehealth: Payer: Self-pay | Admitting: Adult Health

## 2023-07-07 NOTE — Telephone Encounter (Signed)
Tiffany Cummings called because she picked up her Ambien.  From the visit you were going to send in Ambien CR to see if it would help.  But the prescription was for the regular.  She didn't realize it was wrong until she got home so now she has paid for the wrong medication and can't take it back.  If you send in the correct prescription will insurance pay for it?  She would to try the new strength but unsure what to do.  Please advise.

## 2023-07-09 ENCOUNTER — Other Ambulatory Visit: Payer: Self-pay | Admitting: Adult Health

## 2023-07-09 DIAGNOSIS — G4700 Insomnia, unspecified: Secondary | ICD-10-CM

## 2023-07-09 MED ORDER — ZOLPIDEM TARTRATE ER 12.5 MG PO TBCR: 12.5000 mg | EXTENDED_RELEASE_TABLET | Freq: Every evening | ORAL | 2 refills | Status: DC | PRN

## 2023-07-09 NOTE — Telephone Encounter (Signed)
I do not see anything noted about Ambien. Its likely the CR will need a prior authorization anyway.

## 2023-07-16 ENCOUNTER — Telehealth: Payer: Self-pay

## 2023-07-18 NOTE — Telephone Encounter (Signed)
Prior Authorization submitted for Zolpidem 12.5 mg approval received through 01/14/2024 with [employer].

## 2023-07-30 ENCOUNTER — Telehealth (INDEPENDENT_AMBULATORY_CARE_PROVIDER_SITE_OTHER): Payer: 59 | Admitting: Adult Health

## 2023-07-30 ENCOUNTER — Encounter: Payer: Self-pay | Admitting: Adult Health

## 2023-07-30 DIAGNOSIS — F331 Major depressive disorder, recurrent, moderate: Secondary | ICD-10-CM | POA: Diagnosis not present

## 2023-07-30 DIAGNOSIS — G4700 Insomnia, unspecified: Secondary | ICD-10-CM

## 2023-07-30 DIAGNOSIS — F422 Mixed obsessional thoughts and acts: Secondary | ICD-10-CM

## 2023-07-30 DIAGNOSIS — F411 Generalized anxiety disorder: Secondary | ICD-10-CM | POA: Diagnosis not present

## 2023-07-30 MED ORDER — ESZOPICLONE 2 MG PO TABS: 2.0000 mg | ORAL_TABLET | Freq: Every evening | ORAL | 1 refills | Status: DC | PRN

## 2023-07-30 MED ORDER — ALPRAZOLAM 1 MG PO TABS: 1.0000 mg | ORAL_TABLET | Freq: Every evening | ORAL | 1 refills | Status: DC | PRN

## 2023-07-30 NOTE — Progress Notes (Addendum)
 Tiffany Cummings [id number] August 11, 1985 38 y.o.  Virtual Visit via Video Note  I connected with pt @ on 07/30/23 at  4:30 PM EST by a video enabled telemedicine application and verified that I am speaking with the correct person using two identifiers.   I discussed the limitations of evaluation and management by telemedicine and the availability of in person appointments. The patient expressed understanding and agreed to proceed.  I discussed the assessment and treatment plan with the patient. The patient was provided an opportunity to ask questions and all were answered. The patient agreed with the plan and demonstrated an understanding of the instructions.   The patient was advised to call back or seek an in-person evaluation if the symptoms worsen or if the condition fails to improve as anticipated.  I provided 25 minutes of non-face-to-face time during this encounter.  The patient was located at home.  The provider was located at [facility].   Angeline LOISE Sayers, NP   Subjective:   Patient ID:  Tiffany Cummings is a 38 y.o. (DOB 06-Oct-1985) female.  Chief Complaint: No chief complaint on file.   HPI Tiffany Cummings presents for follow-up of MDD, GAD, obsessional thoughts, and insomnia.  Describes mood today as ok. Pleasant. Denies tearfulness. Mood symptoms - denies depression. Reports some situational anxiety and irritability. Reports one recent anxiety attack. Denies panic attacks. Reports worry, rumination, and over thinking. Reports obsessive thoughts and acts.  Mood is stable. Feels like the Zoloft 150mg  works well for her. Has been working with a therapist - Marval Bunde. Varying interest and motivation. Taking medications as prescribed.  Energy levels lower. Active, does not have a regular exercise routine. Walking daily. Enjoys some usual interests and activities. Single. Not dating. Lives with mother - 4 cats and 2 dogs. Spending time with family and  friends. Appetite adequate. Weight stable - 165 pounds. Reports ongoing sleeping difficulties. Averages 4  hours of broken sleep. Reports sleep study in August - normal.  Reports focus and concentration difficulties. Completing tasks. Managing aspects of household. Works full time as a runner, broadcasting/film/video.  Denies SI or HI.  Denies AH or VH. Denies self harm.  Denies substance use.   Previous medication trials: Zoloft, Ativan - crying all the time, Ambien , Lunesta , Hydroxyzine - bad dreams, Adderall XR, Adderall IR, Clonidine , Restoril , Trazadone, Belsomra, Elavil, Clonazepam .  Review of Systems:  Review of Systems  Musculoskeletal:  Negative for gait problem.  Neurological:  Negative for tremors.  Psychiatric/Behavioral:         Please refer to HPI    Medications: I have reviewed the patient's current medications.  Current Outpatient Medications  Medication Sig Dispense Refill   eszopiclone  (LUNESTA ) 2 MG TABS tablet Take 1 tablet (2 mg total) by mouth at bedtime as needed for sleep. Take immediately before bedtime 30 tablet 1   ALPRAZolam  (XANAX ) 1 MG tablet Take 1 tablet (1 mg total) by mouth at bedtime as needed for anxiety. 30 tablet 1   amphetamine -dextroamphetamine  (ADDERALL) 20 MG tablet Take 1 tablet (20 mg total) by mouth daily. 30 tablet 0   cloNIDine  (CATAPRES ) 0.1 MG tablet Take 1 tablet (0.1 mg total) by mouth daily. 14 tablet 0   sertraline (ZOLOFT) 50 MG tablet Take 200 mg by mouth daily.     No current facility-administered medications for this visit.    Medication Side Effects: None  Allergies:  Allergies  Allergen Reactions   Flagyl [Metronidazole] Nausea And Vomiting   Ceclor [  Cefaclor] Rash   Septra [Sulfamethoxazole-Trimethoprim] Rash    No past medical history on file.  No family history on file.  Social History   Socioeconomic History   Marital status: Single    Spouse name: Not on file   Number of children: Not on file   Years of education: Not on file    Highest education level: Not on file  Occupational History   Not on file  Tobacco Use   Smoking status: Never   Smokeless tobacco: Never  Substance and Sexual Activity   Alcohol use: No   Drug use: No   Sexual activity: Not on file  Other Topics Concern   Not on file  Social History Narrative   Not on file   Social Drivers of Health   Financial Resource Strain: Not on file  Food Insecurity: Not on file  Transportation Needs: Not on file  Physical Activity: Not on file  Stress: Not on file  Social Connections: Unknown (11/19/2021)   Received from [facility], [facility]   Social Network    Social Network: Not on file  Intimate Partner Violence: Unknown (11/19/2021)   Received from [facility], [facility]   HITS    Physically Hurt: Not on file    Insult or Talk Down To: Not on file    Threaten Physical Harm: Not on file    Scream or Curse: Not on file    Past Medical History, Surgical history, Social history, and Family history were reviewed and updated as appropriate.   Please see review of systems for further details on the patient's review from today.   Objective:   Physical Exam:  There were no vitals taken for this visit.  Physical Exam Constitutional:      General: She is not in acute distress. Musculoskeletal:        General: No deformity.  Neurological:     Mental Status: She is alert and oriented to person, place, and time.     Coordination: Coordination normal.  Psychiatric:        Attention and Perception: Attention and perception normal. She does not perceive auditory or visual hallucinations.        Mood and Affect: Affect is not labile, blunt, angry or inappropriate.        Speech: Speech normal.        Behavior: Behavior normal.        Thought Content: Thought content normal. Thought content is not paranoid or delusional. Thought content does not include homicidal or suicidal ideation. Thought content does not include homicidal or  suicidal plan.        Cognition and Memory: Cognition and memory normal.        Judgment: Judgment normal.     Comments: Insight intact     Lab Review:  No results found for: NA, K, CL, CO2, GLUCOSE, BUN, CREATININE, CALCIUM, PROT, ALBUMIN, AST, ALT, ALKPHOS, BILITOT, GFRNONAA, GFRAA  No results found for: WBC, RBC, HGB, HCT, PLT, MCV, MCH, MCHC, RDW, LYMPHSABS, MONOABS, EOSABS, BASOSABS  No results found for: POCLITH, LITHIUM   No results found for: PHENYTOIN, PHENOBARB, VALPROATE, CBMZ   .res Assessment: Plan:    Plan:  PDMP reviewed  Zoloft 150mg  daily - PCP prescribes  Adderall 20mg  daily - d/c for now  Xanax  0.5mg  to 1mg  at hs  Add Lunesta  2mg  at hs  NAC tabs daily  Lab results from [employer] [facility] available - will have information sent over.  PCP  at [facility].   Therapist - previously seen by Boby Signe Mitchell testing - results given.   RTC 4 weeks  Patient advised to contact office with any questions, adverse effects, or acute worsening in signs and symptoms. Diagnoses and all orders for this visit:  Major depressive disorder, recurrent episode, moderate (HCC)  Generalized anxiety disorder -     ALPRAZolam  (XANAX ) 1 MG tablet; Take 1 tablet (1 mg total) by mouth at bedtime as needed for anxiety.  Insomnia, unspecified type -     eszopiclone  (LUNESTA ) 2 MG TABS tablet; Take 1 tablet (2 mg total) by mouth at bedtime as needed for sleep. Take immediately before bedtime -     ALPRAZolam  (XANAX ) 1 MG tablet; Take 1 tablet (1 mg total) by mouth at bedtime as needed for anxiety.  Mixed obsessional thoughts and acts     Please see After Visit Summary for patient specific instructions.  No future appointments.   No orders of the defined types were placed in this encounter.     -------------------------------

## 2023-07-30 NOTE — Addendum Note (Signed)
 Addended by: Dorothyann Gibbs on: 07/30/2023 04:59 PM   Modules accepted: Level of Service

## 2023-08-29 ENCOUNTER — Telehealth (INDEPENDENT_AMBULATORY_CARE_PROVIDER_SITE_OTHER): Payer: Self-pay | Admitting: Adult Health

## 2023-08-29 DIAGNOSIS — Z0389 Encounter for observation for other suspected diseases and conditions ruled out: Secondary | ICD-10-CM

## 2023-08-29 NOTE — Progress Notes (Signed)
 Patient no show appointment. ? ?

## 2023-09-23 ENCOUNTER — Telehealth: Payer: Self-pay | Admitting: Adult Health

## 2023-09-23 NOTE — Telephone Encounter (Signed)
 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 She feels very anxious about it.  She has mentioned in the teacher and principal meetings and nothing has been done about it. She'd like to have a letter to this effect so she can not be asked about medicine in the classrooms. Her phone number is [phone].

## 2023-09-24 ENCOUNTER — Telehealth: Payer: 59 | Admitting: Adult Health

## 2023-09-24 NOTE — Telephone Encounter (Signed)
 Please review what patient is requesting below and advise if this is something you can do.   Patient reports having to store a patient's medication in her room and she is supposed to give the medication to the patient. She said the principal gave medication in her room yesterday and it took "20 minutes" for patient to chew it. She is worried about the liability, worry about him choking, or possibly spitting some of the medication out. She is asking for a letter based on her dx.  Dr. Shella Spearing - principal at [employer]. She will pick up letter when ready.

## 2023-09-24 NOTE — Telephone Encounter (Signed)
 LVM to [facility]

## 2023-09-24 NOTE — Telephone Encounter (Signed)
 Pt VLM @ 1:13p returning call to [place].

## 2023-09-30 NOTE — Telephone Encounter (Signed)
 Has appt 3/13, can discuss letter then.

## 2023-10-02 ENCOUNTER — Encounter: Payer: Self-pay | Admitting: Adult Health

## 2023-10-02 ENCOUNTER — Telehealth: Admitting: Adult Health

## 2023-10-02 DIAGNOSIS — F331 Major depressive disorder, recurrent, moderate: Secondary | ICD-10-CM

## 2023-10-02 DIAGNOSIS — F422 Mixed obsessional thoughts and acts: Secondary | ICD-10-CM | POA: Diagnosis not present

## 2023-10-02 DIAGNOSIS — G4700 Insomnia, unspecified: Secondary | ICD-10-CM

## 2023-10-02 DIAGNOSIS — F411 Generalized anxiety disorder: Secondary | ICD-10-CM

## 2023-10-02 MED ORDER — ALPRAZOLAM 1 MG PO TABS: 1.0000 mg | ORAL_TABLET | Freq: Two times a day (BID) | ORAL | 2 refills | Status: DC | PRN

## 2023-10-02 MED ORDER — ESZOPICLONE 3 MG PO TABS: 3.0000 mg | ORAL_TABLET | Freq: Every evening | ORAL | 2 refills | Status: DC | PRN

## 2023-10-02 NOTE — Progress Notes (Signed)
 Tiffany Cummings [id number] 08-23-1985 38 y.o.  Virtual Visit via Video Note  I connected with pt @ on 10/02/23 at  4:00 PM EDT by a video enabled telemedicine application and verified that I am speaking with the correct person using two identifiers.   I discussed the limitations of evaluation and management by telemedicine and the availability of in person appointments. The patient expressed understanding and agreed to proceed.  I discussed the assessment and treatment plan with the patient. The patient was provided an opportunity to ask questions and all were answered. The patient agreed with the plan and demonstrated an understanding of the instructions.   The patient was advised to call back or seek an in-person evaluation if the symptoms worsen or if the condition fails to improve as anticipated.  I provided 25 minutes of non-face-to-face time during this encounter.  The patient was located at home.  The provider was located at [facility].   Dorothyann Gibbs, NP   Subjective:   Patient ID:  Tiffany Cummings is a 38 y.o. (DOB 03-15-1986) female.  Chief Complaint: No chief complaint on file.   HPI Tiffany Cummings presents for follow-up of MDD, GAD, obsessional thoughts and insomnia.  Describes mood today as "ok". Pleasant. Denies tearfulness. Mood symptoms - denies depression, but feels overwhelmed. Reports improved interest and motivation. Reports anxiety - "always". Reports irritability - mostly surrounding her mother. Denies panic attacks, but has anxiety attacks. Reports worry, rumination and over thinking. Reports obsessive thoughts and acts - hand washing. Mood is consistent. Feels like current medications are helpful. Working with therapist - Rockne Menghini. Taking medications as prescribed.  Energy levels lower. Active, has a regular exercise routine. Walking daily. Enjoys some usual interests and activities. Single. Not dating. Lives with mother - 4 cats and 2 dogs.  Spending time with family and friends. Appetite adequate. Weight stable - 165 pounds. Reports ongoing sleeping difficulties. Averages 4 hours of broken sleep. Reports focus and concentration difficulties. Completing tasks. Managing aspects of household. 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  Denies AH or VH. Denies self harm.  Denies substance use.   Previous medication trials: Zoloft, Ativan - crying all the time, Ambien, Lunesta, Hydroxyzine - bad dreams, Adderall XR, Adderall IR, Clonidine, Restoril, Trazadone, Belsomra, Elavil, Clonazepam.   Review of Systems:  Review of Systems  Musculoskeletal:  Negative for gait problem.  Neurological:  Negative for tremors.  Psychiatric/Behavioral:         Please refer to HPI    Medications: I have reviewed the patient's current medications.  Current Outpatient Medications  Medication Sig Dispense Refill   ALPRAZolam (XANAX) 1 MG tablet Take 1 tablet (1 mg total) by mouth at bedtime as needed for anxiety. 30 tablet 1   amphetamine-dextroamphetamine (ADDERALL) 20 MG tablet Take 1 tablet (20 mg total) by mouth daily. 30 tablet 0   cloNIDine (CATAPRES) 0.1 MG tablet Take 1 tablet (0.1 mg total) by mouth daily. 14 tablet 0   eszopiclone (LUNESTA) 2 MG TABS tablet Take 1 tablet (2 mg total) by mouth at bedtime as needed for sleep. Take immediately before bedtime 30 tablet 1   sertraline (ZOLOFT) 50 MG tablet Take 200 mg by mouth daily.     No current facility-administered medications for this visit.    Medication Side Effects: None  Allergies:  Allergies  Allergen Reactions   Flagyl [Metronidazole] Nausea And Vomiting   Ceclor [Cefaclor] Rash   Septra [Sulfamethoxazole-Trimethoprim]  Rash    No past medical history on file.  No family history on file.  Social History   Socioeconomic History   Marital status: Single    Spouse name: Not on file   Number of children: Not on file   Years of education: Not on file    Highest education level: Not on file  Occupational History   Not on file  Tobacco Use   Smoking status: Never   Smokeless tobacco: Never  Substance and Sexual Activity   Alcohol use: No   Drug use: No   Sexual activity: Not on file  Other Topics Concern   Not on file  Social History Narrative   Not on file   Social Drivers of Health   Financial Resource Strain: Not on file  Food Insecurity: Not on file  Transportation Needs: Not on file  Physical Activity: Not on file  Stress: Not on file  Social Connections: Unknown (11/19/2021)   Received from [facility], [facility]   Social Network    Social Network: Not on file  Intimate Partner Violence: Unknown (11/19/2021)   Received from [facility], [facility]   HITS    Physically Hurt: Not on file    Insult or Talk Down To: Not on file    Threaten Physical Harm: Not on file    Scream or Curse: Not on file    Past Medical History, Surgical history, Social history, and Family history were reviewed and updated as appropriate.   Please see review of systems for further details on the patient's review from today.   Objective:   Physical Exam:  There were no vitals taken for this visit.  Physical Exam Constitutional:      General: She is not in acute distress. Musculoskeletal:        General: No deformity.  Neurological:     Mental Status: She is alert and oriented to person, place, and time.     Coordination: Coordination normal.  Psychiatric:        Attention and Perception: Attention and perception normal. She does not perceive auditory or visual hallucinations.        Mood and Affect: Affect is not labile, blunt, angry or inappropriate.        Speech: Speech normal.        Behavior: Behavior normal.        Thought Content: Thought content normal. Thought content is not paranoid or delusional. Thought content does not include homicidal or suicidal ideation. Thought content does not include homicidal or suicidal  plan.        Cognition and Memory: Cognition and memory normal.        Judgment: Judgment normal.     Comments: Insight intact     Lab Review:  No results found for: "NA", "K", "CL", "CO2", "GLUCOSE", "BUN", "CREATININE", "CALCIUM", "PROT", "ALBUMIN", "AST", "ALT", "ALKPHOS", "BILITOT", "GFRNONAA", "GFRAA"  No results found for: "WBC", "RBC", "HGB", "HCT", "PLT", "MCV", "MCH", "MCHC", "RDW", "LYMPHSABS", "MONOABS", "EOSABS", "BASOSABS"  No results found for: "POCLITH", "LITHIUM"   No results found for: "PHENYTOIN", "PHENOBARB", "VALPROATE", "CBMZ"   .res Assessment: Plan:    Plan:  PDMP reviewed  Zoloft 200mg  daily - PCP prescribes  Xanax 1mg  at hs  Lunesta 2mg  at hs  NAC tabs daily  Lab results from [employer] [facility] available - will have information sent over.  PCP at [facility].   Therapist - previously seen by Nehemiah Settle testing -  results given.   RTC 4 weeks  Patient advised to contact office with any questions, adverse effects, or acute worsening in signs and symptoms.  There are no diagnoses linked to this encounter.   Please see After Visit Summary for patient specific instructions.  Future Appointments  Date Time Provider Department Center  10/02/2023  4:00 PM Elvina Bosch, Thereasa Solo, NP [facility] None    No orders of the defined types were placed in this encounter.     -------------------------------

## 2023-10-30 ENCOUNTER — Telehealth: Admitting: Adult Health

## 2023-10-30 ENCOUNTER — Encounter: Payer: Self-pay | Admitting: Adult Health

## 2023-10-30 DIAGNOSIS — G4700 Insomnia, unspecified: Secondary | ICD-10-CM

## 2023-10-30 DIAGNOSIS — F411 Generalized anxiety disorder: Secondary | ICD-10-CM | POA: Diagnosis not present

## 2023-10-30 DIAGNOSIS — F331 Major depressive disorder, recurrent, moderate: Secondary | ICD-10-CM | POA: Diagnosis not present

## 2023-10-30 DIAGNOSIS — F422 Mixed obsessional thoughts and acts: Secondary | ICD-10-CM

## 2023-10-30 MED ORDER — ESZOPICLONE 1 MG PO TABS: ORAL_TABLET | ORAL | 2 refills | Status: DC

## 2023-10-30 MED ORDER — ALPRAZOLAM 1 MG PO TABS: 1.0000 mg | ORAL_TABLET | Freq: Two times a day (BID) | ORAL | 2 refills | Status: DC | PRN

## 2023-10-30 NOTE — Progress Notes (Signed)
 Tiffany Cummings [id number] 01/31/1986 38 y.o.  Virtual Visit via Video Note  I connected with pt @ on 10/30/23 at  3:30 PM EDT by a video enabled telemedicine application and verified that I am speaking with the correct person using two identifiers.   I discussed the limitations of evaluation and management by telemedicine and the availability of in person appointments. The patient expressed understanding and agreed to proceed.  I discussed the assessment and treatment plan with the patient. The patient was provided an opportunity to ask questions and all were answered. The patient agreed with the plan and demonstrated an understanding of the instructions.   The patient was advised to call back or seek an in-person evaluation if the symptoms worsen or if the condition fails to improve as anticipated.  I provided 25 minutes of non-face-to-face time during this encounter.  The patient was located at home.  The provider was located at [facility].   Dorothyann Gibbs, NP   Subjective:   Patient ID:  Tiffany Cummings is a 38 y.o. (DOB 07-21-1986) female.  Chief Complaint: No chief complaint on file.   HPI Tiffany Cummings presents for follow-up of MDD, GAD, obsessional thoughts and insomnia.  Describes mood today as "ok". Pleasant. Denies tearfulness. Mood symptoms - denies depression. Reports improved interest and motivation. Reports anxiety and irritability. Denies panic attacks. Reports worry, rumination and over thinking. Reports obsessive thoughts and acts - hand washing. Mood is consistent. Stating "I feel like I'm doing ok". Taking medications as prescribed.  Energy levels lower. Active, has a regular exercise routine. Walking daily. Enjoys some usual interests and activities. Single. Not dating. Lives with mother - 4 cats and 2 dogs. Spending time with family and friends. Appetite adequate. Weight stable - 165 pounds. Reports ongoing sleeping difficulties. Averages 4 to 5  hours of broken sleep. Reports focus and concentration difficulties. Completing tasks. Managing aspects of household. 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  Denies AH or VH. Denies self harm.  Denies substance use.   Previous medication trials: Zoloft, Ativan - crying all the time, Ambien, Lunesta, Hydroxyzine - bad dreams, Adderall XR, Adderall IR, Clonidine, Restoril, Trazadone, Belsomra, Elavil, Clonazepam.   Review of Systems:  Review of Systems  Musculoskeletal:  Negative for gait problem.  Neurological:  Negative for tremors.  Psychiatric/Behavioral:         Please refer to HPI    Medications: I have reviewed the patient's current medications.  Current Outpatient Medications  Medication Sig Dispense Refill   ALPRAZolam (XANAX) 1 MG tablet Take 1 tablet (1 mg total) by mouth 2 (two) times daily as needed for anxiety. 60 tablet 2   eszopiclone 3 MG TABS Take 1 tablet (3 mg total) by mouth at bedtime as needed for sleep. Take immediately before bedtime 30 tablet 2   sertraline (ZOLOFT) 50 MG tablet Take 200 mg by mouth daily.     No current facility-administered medications for this visit.    Medication Side Effects: None  Allergies:  Allergies  Allergen Reactions   Flagyl [Metronidazole] Nausea And Vomiting   Ceclor [Cefaclor] Rash   Septra [Sulfamethoxazole-Trimethoprim] Rash    No past medical history on file.  No family history on file.  Social History   Socioeconomic History   Marital status: Single    Spouse name: Not on file   Number of children: Not on file   Years of education: Not on file   Highest  education level: Not on file  Occupational History   Not on file  Tobacco Use   Smoking status: Never   Smokeless tobacco: Never  Substance and Sexual Activity   Alcohol use: No   Drug use: No   Sexual activity: Not on file  Other Topics Concern   Not on file  Social History Narrative   Not on file   Social Drivers of Health    Financial Resource Strain: Not on file  Food Insecurity: Not on file  Transportation Needs: Not on file  Physical Activity: Not on file  Stress: Not on file  Social Connections: Unknown (11/19/2021)   Received from [facility], [facility]   Social Network    Social Network: Not on file  Intimate Partner Violence: Unknown (11/19/2021)   Received from [facility], [facility]   HITS    Physically Hurt: Not on file    Insult or Talk Down To: Not on file    Threaten Physical Harm: Not on file    Scream or Curse: Not on file    Past Medical History, Surgical history, Social history, and Family history were reviewed and updated as appropriate.   Please see review of systems for further details on the patient's review from today.   Objective:   Physical Exam:  There were no vitals taken for this visit.  Physical Exam Constitutional:      General: She is not in acute distress. Musculoskeletal:        General: No deformity.  Neurological:     Mental Status: She is alert and oriented to person, place, and time.     Coordination: Coordination normal.  Psychiatric:        Attention and Perception: Attention and perception normal. She does not perceive auditory or visual hallucinations.        Mood and Affect: Mood normal. Mood is not anxious or depressed. Affect is not labile, blunt, angry or inappropriate.        Speech: Speech normal.        Behavior: Behavior normal.        Thought Content: Thought content normal. Thought content is not paranoid or delusional. Thought content does not include homicidal or suicidal ideation. Thought content does not include homicidal or suicidal plan.        Cognition and Memory: Cognition and memory normal.        Judgment: Judgment normal.     Comments: Insight intact     Lab Review:  No results found for: "NA", "K", "CL", "CO2", "GLUCOSE", "BUN", "CREATININE", "CALCIUM", "PROT", "ALBUMIN", "AST", "ALT", "ALKPHOS", "BILITOT",  "GFRNONAA", "GFRAA"  No results found for: "WBC", "RBC", "HGB", "HCT", "PLT", "MCV", "MCH", "MCHC", "RDW", "LYMPHSABS", "MONOABS", "EOSABS", "BASOSABS"  No results found for: "POCLITH", "LITHIUM"   No results found for: "PHENYTOIN", "PHENOBARB", "VALPROATE", "CBMZ"   .res Assessment: Plan:    Plan:  PDMP reviewed  Zoloft 200mg  daily - PCP prescribes  Xanax 1mg  at hs  Lunesta 1mg  - take 3 tablets at hs - weaning off of 3mg  dose  NAC tabs daily  PCP at [facility]   Therapist - previously seen by Nehemiah Settle testing - results given.   RTC 4 weeks  25 minutes spent dedicated to the care of this patient on the date of this encounter to include pre-visit review of records, ordering of medication, post visit documentation, and face-to-face time with the patient discussing MDD, GAD, obsessional thoughts and insomnia. Discussed  continuing current medication regimen.  Discussed potential benefits, risk, and side effects of benzodiazepines to include potential risk of tolerance and dependence, as well as possible drowsiness.  Advised patient not to drive if experiencing drowsiness and to take lowest possible effective dose to minimize risk of dependence and tolerance.   Patient advised to contact office with any questions, adverse effects, or acute worsening in signs and symptoms.  There are no diagnoses linked to this encounter.   Please see After Visit Summary for patient specific instructions.  No future appointments.  No orders of the defined types were placed in this encounter.     -------------------------------

## 2023-12-17 ENCOUNTER — Encounter: Payer: Self-pay | Admitting: Adult Health

## 2023-12-17 ENCOUNTER — Telehealth: Admitting: Adult Health

## 2023-12-17 DIAGNOSIS — F422 Mixed obsessional thoughts and acts: Secondary | ICD-10-CM

## 2023-12-17 DIAGNOSIS — F331 Major depressive disorder, recurrent, moderate: Secondary | ICD-10-CM

## 2023-12-17 DIAGNOSIS — F411 Generalized anxiety disorder: Secondary | ICD-10-CM

## 2023-12-17 DIAGNOSIS — G4700 Insomnia, unspecified: Secondary | ICD-10-CM | POA: Diagnosis not present

## 2023-12-17 MED ORDER — ESZOPICLONE 3 MG PO TABS: 3.0000 mg | ORAL_TABLET | Freq: Every evening | ORAL | 2 refills | Status: DC | PRN

## 2023-12-17 MED ORDER — ALPRAZOLAM 1 MG PO TABS: 1.0000 mg | ORAL_TABLET | Freq: Two times a day (BID) | ORAL | 2 refills | Status: DC | PRN

## 2023-12-17 NOTE — Progress Notes (Signed)
 Tiffany Cummings [id number] 1985-07-26 38 y.o.  Virtual Visit via Video Note  I connected with pt @ on 12/17/23 at  4:00 PM EDT by a video enabled telemedicine application and verified that I am speaking with the correct person using two identifiers.   I discussed the limitations of evaluation and management by telemedicine and the availability of in person appointments. The patient expressed understanding and agreed to proceed.  I discussed the assessment and treatment plan with the patient. The patient was provided an opportunity to ask questions and all were answered. The patient agreed with the plan and demonstrated an understanding of the instructions.   The patient was advised to call back or seek an in-person evaluation if the symptoms worsen or if the condition fails to improve as anticipated.  I provided 25 minutes of non-face-to-face time during this encounter.  The patient was located at home.  The provider was located at [facility].   Reagan Camera, NP   Subjective:   Patient ID:  Tiffany Cummings is a 38 y.o. (DOB February 21, 1986) female.  Chief Complaint: No chief complaint on file.   HPI Tiffany Cummings presents for follow-up of MDD, GAD, obsessional thoughts and insomnia.  Describes mood today as "ok". Pleasant. Denies tearfulness. Mood symptoms - denies depression. Reports variable interest and motivation - "depends on how tired she is". Reports anxiety and irritability. Denies panic attacks. Reports worry, rumination and over thinking. Reports obsessive thoughts and acts - hand washing. Reports mood as consistent. Stating "I think I'm doing ok". Taking medications as prescribed.  Energy levels lower - "always tired". Active, has a regular exercise routine. Walking daily. Enjoys some usual interests and activities. Single. Not dating. Lives with mother - 4 cats and 2 dogs. Spending time with family and friends. Appetite adequate. Weight stable - 165  pounds. Reports ongoing sleeping difficulties. Averages 4 to 5 hours of broken sleep. Reports focus and concentration difficulties. Completing tasks. Managing aspects of household. 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  Denies AH or VH. Denies self harm.  Denies substance use.   Previous medication trials: Zoloft, Ativan - crying all the time, Ambien , Lunesta , Hydroxyzine - bad dreams, Adderall XR, Adderall IR, Clonidine , Restoril , Trazadone, Belsomra, Elavil, Clonazepam .  Review of Systems:  Review of Systems  Musculoskeletal:  Negative for gait problem.  Neurological:  Negative for tremors.  Psychiatric/Behavioral:         Please refer to HPI    Medications: I have reviewed the patient's current medications.  Current Outpatient Medications  Medication Sig Dispense Refill   ALPRAZolam  (XANAX ) 1 MG tablet Take 1 tablet (1 mg total) by mouth 2 (two) times daily as needed for anxiety. 60 tablet 2   eszopiclone  (LUNESTA ) 1 MG TABS tablet Take three tablets at bedtime. 90 tablet 2   Eszopiclone  3 MG TABS Take 1 tablet (3 mg total) by mouth at bedtime as needed. Take immediately before bedtime 30 tablet 2   sertraline (ZOLOFT) 50 MG tablet Take 200 mg by mouth daily.     No current facility-administered medications for this visit.    Medication Side Effects: None  Allergies:  Allergies  Allergen Reactions   Flagyl [Metronidazole] Nausea And Vomiting   Ceclor [Cefaclor] Rash   Septra [Sulfamethoxazole-Trimethoprim] Rash    No past medical history on file.  No family history on file.  Social History   Socioeconomic History   Marital status: Single    Spouse  name: Not on file   Number of children: Not on file   Years of education: Not on file   Highest education level: Not on file  Occupational History   Not on file  Tobacco Use   Smoking status: Never   Smokeless tobacco: Never  Substance and Sexual Activity   Alcohol use: No   Drug use: No   Sexual  activity: Not on file  Other Topics Concern   Not on file  Social History Narrative   Not on file   Social Drivers of Health   Financial Resource Strain: Not on file  Food Insecurity: Not on file  Transportation Needs: Not on file  Physical Activity: Not on file  Stress: Not on file  Social Connections: Unknown (11/19/2021)   Received [facility], [facility]   Social Network    Social Network: Not on file  Intimate Partner Violence: Unknown (11/19/2021)   Received from [facility], [facility]   HITS    Physically Hurt: Not on file    Insult or Talk Down To: Not on file    Threaten Physical Harm: Not on file    Scream or Curse: Not on file    Past Medical History, Surgical history, Social history, and Family history were reviewed and updated as appropriate.   Please see review of systems for further details on the patient's review from today.   Objective:   Physical Exam:  There were no vitals taken for this visit.  Physical Exam Constitutional:      General: She is not in acute distress. Musculoskeletal:        General: No deformity.  Neurological:     Mental Status: She is alert and oriented to person, place, and time.     Coordination: Coordination normal.  Psychiatric:        Attention and Perception: Attention and perception normal. She does not perceive auditory or visual hallucinations.        Mood and Affect: Mood normal. Mood is not anxious or depressed. Affect is not labile, blunt, angry or inappropriate.        Speech: Speech normal.        Behavior: Behavior normal.        Thought Content: Thought content normal. Thought content is not paranoid or delusional. Thought content does not include homicidal or suicidal ideation. Thought content does not include homicidal or suicidal plan.        Cognition and Memory: Cognition and memory normal.        Judgment: Judgment normal.     Comments: Insight intact     Lab Review:  No results found for:  "NA", "K", "CL", "CO2", "GLUCOSE", "BUN", "CREATININE", "CALCIUM", "PROT", "ALBUMIN", "AST", "ALT", "ALKPHOS", "BILITOT", "GFRNONAA", "GFRAA"  No results found for: "WBC", "RBC", "HGB", "HCT", "PLT", "MCV", "MCH", "MCHC", "RDW", "LYMPHSABS", "MONOABS", "EOSABS", "BASOSABS"  No results found for: "POCLITH", "LITHIUM"   No results found for: "PHENYTOIN", "PHENOBARB", "VALPROATE", "CBMZ"   .res Assessment: Plan:    Plan:  PDMP reviewed  Zoloft 200mg  daily - PCP prescribes  Xanax  1mg  - 2 at hs Lunesta  1mg  - takes 2 tablets at hs - weaning off  NAC tabs daily  PCP at [facility].   Therapist - previously seen by Irean Manner testing - results given.   RTC 6 weeks  25 minutes spent dedicated to the care of this patient on the date of this encounter to include pre-visit review of  records, ordering of medication, post visit documentation, and face-to-face time with the patient discussing MDD, GAD, obsessional thoughts and insomnia. Discussed continuing current medication regimen.  Discussed potential benefits, risk, and side effects of benzodiazepines to include potential risk of tolerance and dependence, as well as possible drowsiness.  Advised patient not to drive if experiencing drowsiness and to take lowest possible effective dose to minimize risk of dependence and tolerance.   Patient advised to contact office with any questions, adverse effects, or acute worsening in signs and symptoms.  Diagnoses and all orders for this visit:  Major depressive disorder, recurrent episode, moderate (HCC)  Generalized anxiety disorder -     ALPRAZolam  (XANAX ) 1 MG tablet; Take 1 tablet (1 mg total) by mouth 2 (two) times daily as needed for anxiety.  Mixed obsessional thoughts and acts  Insomnia, unspecified type -     ALPRAZolam  (XANAX ) 1 MG tablet; Take 1 tablet (1 mg total) by mouth 2 (two) times daily as needed for anxiety. -     Eszopiclone  3 MG TABS; Take 1 tablet  (3 mg total) by mouth at bedtime as needed. Take immediately before bedtime     Please see After Visit Summary for patient specific instructions.  No future appointments.   No orders of the defined types were placed in this encounter.     -------------------------------

## 2023-12-30 ENCOUNTER — Telehealth: Payer: Self-pay | Admitting: Adult Health

## 2023-12-30 NOTE — Telephone Encounter (Signed)
 Pt called requesting Eszopiclone  3 mg Rx canc and send in Eszopiclone  1 mg 2/d. Pt weaning off.

## 2023-12-30 NOTE — Telephone Encounter (Signed)
 Pt picked up Rx for 3 mg on 5/28. Canceled RF. Canceled previous Rx from 4/10 for 1 mg tablets, 3 at bedtime as well.

## 2024-01-05 ENCOUNTER — Other Ambulatory Visit: Payer: Self-pay

## 2024-01-05 DIAGNOSIS — G4700 Insomnia, unspecified: Secondary | ICD-10-CM

## 2024-01-05 MED ORDER — ESZOPICLONE 1 MG PO TABS: ORAL_TABLET | ORAL | 0 refills | Status: DC

## 2024-01-05 NOTE — Telephone Encounter (Signed)
 Pended Rx for Lunesta  1 mg, 2 a night. Trying to wean.

## 2024-01-15 ENCOUNTER — Telehealth: Payer: Self-pay

## 2024-01-15 NOTE — Telephone Encounter (Signed)
 Submitted a PA for Eszopiclone  1 mg tabs #60 due to pt tapering down, pending response from [employer].

## 2024-01-16 NOTE — Telephone Encounter (Signed)
 Tried to do a PA for the generic Lunesta  1 mg #60 but it was denied for quantity which I suspected although I noted pt tapering down. She can get #30 only.

## 2024-01-28 ENCOUNTER — Encounter: Payer: Self-pay | Admitting: Adult Health

## 2024-01-28 ENCOUNTER — Telehealth: Admitting: Adult Health

## 2024-01-28 DIAGNOSIS — F411 Generalized anxiety disorder: Secondary | ICD-10-CM | POA: Diagnosis not present

## 2024-01-28 DIAGNOSIS — G4700 Insomnia, unspecified: Secondary | ICD-10-CM

## 2024-01-28 DIAGNOSIS — F331 Major depressive disorder, recurrent, moderate: Secondary | ICD-10-CM

## 2024-01-28 DIAGNOSIS — F422 Mixed obsessional thoughts and acts: Secondary | ICD-10-CM

## 2024-01-28 MED ORDER — ESZOPICLONE 1 MG PO TABS: ORAL_TABLET | ORAL | 0 refills | Status: DC

## 2024-01-28 MED ORDER — GABAPENTIN 100 MG PO CAPS: 100.0000 mg | ORAL_CAPSULE | Freq: Every day | ORAL | 2 refills | Status: AC

## 2024-01-28 MED ORDER — CLONAZEPAM 1 MG PO TABS: 1.0000 mg | ORAL_TABLET | Freq: Every day | ORAL | 0 refills | Status: DC

## 2024-01-28 NOTE — Progress Notes (Signed)
 Tiffany Cummings [id number] [birth year] 38 y.o.  Virtual Visit via Video Note  I connected with pt @ on 01/28/24 at  4:30 PM EDT by a video enabled telemedicine application and verified that I am speaking with the correct person using two identifiers.   I discussed the limitations of evaluation and management by telemedicine and the availability of in person appointments. The patient expressed understanding and agreed to proceed.  I discussed the assessment and treatment plan with the patient. The patient was provided an opportunity to ask questions and all were answered. The patient agreed with the plan and demonstrated an understanding of the instructions.   The patient was advised to call back or seek an in-person evaluation if the symptoms worsen or if the condition fails to improve as anticipated.  I provided 25 minutes of non-face-to-face time during this encounter.  The patient was located at home.  The provider was located at [facility].   Angeline LOISE Sayers, NP   Subjective:   Patient ID:  Tiffany Cummings is a 38 y.o. (DOB 1986/01/05) female.  Chief Complaint: No chief complaint on file.   HPI Tiffany Cummings presents for follow-up of MDD, GAD, obsessional thoughts and insomnia.  Describes mood today as ok. Pleasant. Denies tearfulness. Mood symptoms - denies depression. Reports variable interest and motivation - hard to get started on things. Reports anxiety most of the time. Reports situational irritability. Denies panic attacks - occasional anxiety attacks. Reports worry, rumination and over thinking. Reports obsessive thoughts and acts - hand washing. Reports mood as stable. Stating I think I'm doing ok. Taking medications as prescribed.  Energy levels lower - tired all the time. Active, has a regular exercise routine. Walking daily. Enjoys some usual interests and activities. Single. Not dating. Lives with mother - 4 cats and 2 dogs. Spending time with  family and friends. Appetite adequate. Weight stable - 165 pounds. Reports ongoing sleeping difficulties. Averages 4 hours of broken sleep. Reports focus and concentration difficulties. Completing tasks. Managing aspects of household. 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  Denies AH or VH. Denies self harm.  Denies substance use.   Previous medication trials: Zoloft, Ativan - crying all the time, Ambien , Lunesta , Hydroxyzine - bad dreams, Adderall XR, Adderall IR, Clonidine , Restoril , Trazadone, Belsomra, Elavil, Clonazepam .   Review of Systems:  Review of Systems  Musculoskeletal:  Negative for gait problem.  Neurological:  Negative for tremors.  Psychiatric/Behavioral:         Please refer to HPI    Medications: I have reviewed the patient's current medications.  Current Outpatient Medications  Medication Sig Dispense Refill   clonazePAM  (KLONOPIN ) 1 MG tablet Take 1 tablet (1 mg total) by mouth daily. 30 tablet 0   gabapentin  (NEURONTIN ) 100 MG capsule Take 1 capsule (100 mg total) by mouth at bedtime. 30 capsule 2   ALPRAZolam  (XANAX ) 1 MG tablet Take 1 tablet (1 mg total) by mouth 2 (two) times daily as needed for anxiety. 60 tablet 2   eszopiclone  (LUNESTA ) 1 MG TABS tablet Take one tablet at bedtime. 30 tablet 0   sertraline (ZOLOFT) 50 MG tablet Take 200 mg by mouth daily.     No current facility-administered medications for this visit.    Medication Side Effects: None  Allergies:  Allergies  Allergen Reactions   Flagyl [Metronidazole] Nausea And Vomiting   Ceclor [Cefaclor] Rash   Septra [Sulfamethoxazole-Trimethoprim] Rash    No past medical history  on file.  No family history on file.  Social History   Socioeconomic History   Marital status: Single    Spouse name: Not on file   Number of children: Not on file   Years of education: Not on file   Highest education level: Not on file  Occupational History   Not on file  Tobacco Use   Smoking  status: Never   Smokeless tobacco: Never  Substance and Sexual Activity   Alcohol use: No   Drug use: No   Sexual activity: Not on file  Other Topics Concern   Not on file  Social History Narrative   Not on file   Social Drivers of Health   Financial Resource Strain: Not on file  Food Insecurity: Not on file  Transportation Needs: Not on file  Physical Activity: Not on file  Stress: Not on file  Social Connections: Unknown (11/19/2021)   [facility]   Social Network    Social Network: Not on file  Intimate Partner Violence: Unknown (11/19/2021)   Received from [facility]   HITS    Physically Hurt: Not on file    Insult or Talk Down To: Not on file    Threaten Physical Harm: Not on file    Scream or Curse: Not on file    Past Medical History, Surgical history, Social history, and Family history were reviewed and updated as appropriate.   Please see review of systems for further details on the patient's review from today.   Objective:   Physical Exam:  There were no vitals taken for this visit.  Physical Exam Constitutional:      General: She is not in acute distress. Musculoskeletal:        General: No deformity.  Neurological:     Mental Status: She is alert and oriented to person, place, and time.     Coordination: Coordination normal.  Psychiatric:        Attention and Perception: Attention and perception normal. She does not perceive auditory or visual hallucinations.        Mood and Affect: Mood normal. Mood is not anxious or depressed. Affect is not labile, blunt, angry or inappropriate.        Speech: Speech normal.        Behavior: Behavior normal.        Thought Content: Thought content normal. Thought content is not paranoid or delusional. Thought content does not include homicidal or suicidal ideation. Thought content does not include homicidal or suicidal plan.        Cognition and Memory: Cognition and memory normal.        Judgment:  Judgment normal.     Comments: Insight intact     Lab Review:  No results found for: NA, K, CL, CO2, GLUCOSE, BUN, CREATININE, CALCIUM, PROT, ALBUMIN, AST, ALT, ALKPHOS, BILITOT, GFRNONAA, GFRAA  No results found for: WBC, RBC, HGB, HCT, PLT, MCV, MCH, MCHC, RDW, LYMPHSABS, MONOABS, EOSABS, BASOSABS  No results found for: POCLITH, LITHIUM   No results found for: PHENYTOIN, PHENOBARB, VALPROATE, CBMZ   .res Assessment: Plan:    Plan:  PDMP reviewed  Zoloft 200mg  daily - PCP prescribes  D/C Xanax  1mg  at hs Add Clonazepam  1mg  at hs Lunesta  1mg  at hs   Consider Gabapentin  for sleep next visit - script  NAC tabs daily  PCP at [facility].   Therapist - previously seen by Boby Signe Mitchell testing - results given.  RTC 4 weeks  25 minutes spent dedicated to the care of this patient on the date of this encounter to include pre-visit review of records, ordering of medication, post visit documentation, and face-to-face time with the patient discussing MDD, GAD, obsessional thoughts and insomnia. Discussed continuing current medication regimen.  Discussed potential benefits, risk, and side effects of benzodiazepines to include potential risk of tolerance and dependence, as well as possible drowsiness.  Advised patient not to drive if experiencing drowsiness and to take lowest possible effective dose to minimize risk of dependence and tolerance.   Patient advised to contact office with any questions, adverse effects, or acute worsening in signs and symptoms.  Diagnoses and all orders for this visit:  Generalized anxiety disorder -     clonazePAM  (KLONOPIN ) 1 MG tablet; Take 1 tablet (1 mg total) by mouth daily.  Insomnia, unspecified type -     gabapentin  (NEURONTIN ) 100 MG capsule; Take 1 capsule (100 mg total) by mouth at bedtime. -     eszopiclone  (LUNESTA ) 1 MG TABS tablet; Take one  tablet at bedtime.  Mixed obsessional thoughts and acts  Major depressive disorder, recurrent episode, moderate (HCC)     Please see After Visit Summary for patient specific instructions.  No future appointments.   No orders of the defined types were placed in this encounter.     -------------------------------

## 2024-03-26 ENCOUNTER — Encounter (HOSPITAL_COMMUNITY): Payer: Self-pay

## 2024-03-26 ENCOUNTER — Ambulatory Visit (HOSPITAL_COMMUNITY): Admission: EM | Admit: 2024-03-26 | Discharge: 2024-03-26 | Disposition: A | Discharge status: Home / Self Care

## 2024-03-26 DIAGNOSIS — R519 Headache, unspecified: Secondary | ICD-10-CM | POA: Diagnosis not present

## 2024-03-26 DIAGNOSIS — S0990XA Unspecified injury of head, initial encounter: Secondary | ICD-10-CM | POA: Diagnosis not present

## 2024-03-26 NOTE — ED Provider Notes (Signed)
 [facility]    CSN: [id number] Arrival date & time: 03/26/24  1718      History   Chief Complaint Chief Complaint  Patient presents with   Headache    HPI Tiffany Cummings is a 38 y.o. female.  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 Hit on the left side of head. She's having minor headache and left neck discomfort. Did not lose consciousness. No vomiting. No dizziness. School advised she be evaluated No interventions yet  History reviewed. No pertinent past medical history.  There are no active problems to display for this patient.   History reviewed. No pertinent surgical history.  OB History   No obstetric history on file.      Home Medications    Prior to Admission medications   Medication Sig Start Date End Date Taking? Authorizing Provider  ALPRAZolam  (XANAX ) 1 MG tablet Take 1 tablet (1 mg total) by mouth 2 (two) times daily as needed for anxiety. 12/17/23   Mozingo, Regina Nattalie, NP  clonazePAM  (KLONOPIN ) 1 MG tablet Take 1 tablet (1 mg total) by mouth daily. Patient not taking: Reported on 03/26/2024 01/28/24   Mozingo, Regina Nattalie, NP  eszopiclone  (LUNESTA ) 1 MG TABS tablet Take one tablet at bedtime. 01/28/24   Mozingo, Regina Nattalie, NP  gabapentin  (NEURONTIN ) 100 MG capsule Take 1 capsule (100 mg total) by mouth at bedtime. Patient not taking: Reported on 03/26/2024 01/28/24   Mozingo, Regina Nattalie, NP  sertraline (ZOLOFT) 50 MG tablet Take 200 mg by mouth daily.    [provider]    Family History History reviewed. No pertinent family history.  Social History Social History   Tobacco Use   Smoking status: Never   Smokeless tobacco: Never  Vaping Use   Vaping status: Never Used  Substance Use Topics   Alcohol use: No   Drug use: No     Allergies   Flagyl [metronidazole], Ceclor [cefaclor], and Septra [sulfamethoxazole-trimethoprim]   Review of Systems Review of  Systems As per HPI  Physical Exam Triage Vital Signs ED Triage Vitals [03/26/24 1753]  Encounter Vitals Group     BP 110/76     Girls Systolic BP Percentile      Girls Diastolic BP Percentile      Boys Systolic BP Percentile      Boys Diastolic BP Percentile      Pulse Rate 86     Resp 18     Temp 98.2 F (36.8 C)     Temp Source Oral     SpO2 96 %     Weight      Height      Head Circumference      Peak Flow      Pain Score 5     Pain Loc      Pain Education      Exclude from Growth Chart    No data found.  Updated Vital Signs BP 110/76 (BP Location: Left Arm)   Pulse 86   Temp 98.2 F (36.8 C) (Oral)   Resp 18   LMP 03/13/2024 (Exact Date)   SpO2 96%   Physical Exam Vitals and nursing note reviewed.  Constitutional:      General: She is not in acute distress. HENT:     Head: Atraumatic. No contusion, masses or laceration.     Jaw: There is normal jaw occlusion. No tenderness, swelling or  pain on movement.     Comments: Atraumatic. No wound, laceration, abrasion, bruising. Head is non tender to palpation     Right Ear: Tympanic membrane and ear canal normal.     Left Ear: Tympanic membrane and ear canal normal.     Nose: Nose normal.     Mouth/Throat:     Mouth: Mucous membranes are moist.     Pharynx: Oropharynx is clear.  Eyes:     Extraocular Movements: Extraocular movements intact.     Conjunctiva/sclera: Conjunctivae normal.     Pupils: Pupils are equal, round, and reactive to light.  Cardiovascular:     Rate and Rhythm: Normal rate and regular rhythm.     Pulses: Normal pulses.     Heart sounds: Normal heart sounds.  Pulmonary:     Effort: Pulmonary effort is normal.     Breath sounds: Normal breath sounds.  Musculoskeletal:        General: Normal range of motion.     Cervical back: Normal range of motion. No rigidity or tenderness.  Lymphadenopathy:     Cervical: No cervical adenopathy.  Neurological:     Mental Status: She is alert and  oriented to person, place, and time.     Sensory: No sensory deficit.     Motor: No weakness.     Coordination: Coordination normal.     Gait: Gait normal.     Comments: Strength and sensation intact     UC Treatments / Results  Labs (all labs ordered are listed, but only abnormal results are displayed) Labs Reviewed - No data to display  EKG  Radiology No results found.  Procedures Procedures   Medications Ordered in UC Medications - No data to display  Initial Impression / Assessment and Plan / UC Course  I have reviewed the triage vital signs and the nursing notes.  Pertinent labs & imaging results that were available during my care of the patient were reviewed by me and considered in my medical decision making (see chart for details).  Stable vitals, neurologically intact No red flags Minor head injury. Having mild headache.  Discussion with patient about mild concussion, potential symptoms she may develop. Supportive care advised. Monitor for symptoms over next few days. Understands any worsening, needs to be seen. ED precautions.   Final Clinical Impressions(s) / UC Diagnoses   Final diagnoses:  Acute nonintractable headache, unspecified headache type  Minor head injury, initial encounter     Discharge Instructions      For headache and neck pain I recommend to use either ibuprofen or tylenol (or alternate both!) There is some information attached about concussion symptoms - you may or may not develop some of these over the next few days/weeks.  Please monitor symptoms. Please go to the emergency department if symptoms worsen or become severe    ED Prescriptions   None    PDMP not reviewed this encounter.   Jeryl Stabs, [place] 03/26/24 8087

## 2024-03-26 NOTE — Discharge Instructions (Signed)
 For headache and neck pain I recommend to use either ibuprofen or tylenol (or alternate both!) There is some information attached about concussion symptoms - you may or may not develop some of these over the next few days/weeks.  Please monitor symptoms. Please go to the emergency department if symptoms worsen or become severe

## 2024-03-26 NOTE — ED Triage Notes (Signed)
 Pt states she was hit in the head with a volley ball around 2 today. C/o headache, lt ear, neck pain, and jaw clicking. Denies taking any meds. Denies LOC.

## 2024-05-06 ENCOUNTER — Ambulatory Visit: Admitting: Adult Health

## 2024-05-06 DIAGNOSIS — G4700 Insomnia, unspecified: Secondary | ICD-10-CM

## 2024-05-06 DIAGNOSIS — F422 Mixed obsessional thoughts and acts: Secondary | ICD-10-CM

## 2024-05-06 DIAGNOSIS — F331 Major depressive disorder, recurrent, moderate: Secondary | ICD-10-CM | POA: Diagnosis not present

## 2024-05-06 DIAGNOSIS — F411 Generalized anxiety disorder: Secondary | ICD-10-CM

## 2024-05-06 NOTE — Progress Notes (Signed)
 Tiffany Cummings [id number] August 03, 1985 38 y.o.  Subjective:   Patient ID:  Tiffany Cummings is a 38 y.o. (DOB 06-Jan-1986) female.  Chief Complaint: No chief complaint on file.   HPI Tiffany Cummings presents to the office today for follow-up of MDD, GAD, obsessional thoughts and insomnia.  Describes mood today as ok. Pleasant. Denies tearfulness. Mood symptoms - denies depression. Reports stable interest, but lacks  motivation. Reports anxiety all day every day. Reports situational irritability. Denies panic attacks - I have occasional anxiety attacks. Reports worry, rumination and over thinking. Reports obsessive thoughts and acts - hand washing. Reports mood as stable. Stating I feel like I'm doing ok. Taking medications as prescribed.  Energy levels lower - I feel tired all the time. Active, has a regular exercise routine. Walking daily. Enjoys some usual interests and activities. Single. Not dating. Lives with mother - 4 cats and 2 dogs. Spending time with family and friends. Appetite adequate. Weight loss - 160 from 165 pounds. Reports ongoing sleeping difficulties. Averages 4 hours of broken sleep. Reports focus and concentration difficulties. Completing tasks. Managing aspects of household. Works full time as a Runner, broadcasting/film/video - currently out of work. Reports accident at school - diagnosed with a concussion after getting hit in the head by a volley ball. She is currently out of work due to the injury - plans to see nuerology. Denies SI or HI.  Denies AH or VH. Denies self harm.  Denies substance use.   Previous medication trials: Zoloft, Ativan - crying all the time, Ambien , Lunesta , Hydroxyzine - bad dreams, Adderall XR, Adderall IR, Clonidine , Restoril , Trazadone, Belsomra, Elavil, Clonazepam , Gabapentin     Flowsheet Row UC from 03/26/2024 in [facility] RISK CATEGORY No Risk     Review of Systems:  Review of Systems  Musculoskeletal:   Negative for gait problem.  Neurological:  Negative for tremors.  Psychiatric/Behavioral:         Please refer to HPI    Medications: I have reviewed the patient's current medications.  Current Outpatient Medications  Medication Sig Dispense Refill   ALPRAZolam  (XANAX ) 1 MG tablet Take 1 tablet (1 mg total) by mouth 2 (two) times daily as needed for anxiety. 60 tablet 2   clonazePAM  (KLONOPIN ) 1 MG tablet Take 1 tablet (1 mg total) by mouth daily. (Patient not taking: Reported on 03/26/2024) 30 tablet 0   eszopiclone  (LUNESTA ) 1 MG TABS tablet Take one tablet at bedtime. 30 tablet 0   gabapentin  (NEURONTIN ) 100 MG capsule Take 1 capsule (100 mg total) by mouth at bedtime. (Patient not taking: Reported on 03/26/2024) 30 capsule 2   sertraline (ZOLOFT) 50 MG tablet Take 200 mg by mouth daily.     No current facility-administered medications for this visit.    Medication Side Effects: None  Allergies:  Allergies  Allergen Reactions   Flagyl [Metronidazole] Nausea And Vomiting   Ceclor [Cefaclor] Rash   Septra [Sulfamethoxazole-Trimethoprim] Rash    No past medical history on file.  Past Medical History, Surgical history, Social history, and Family history were reviewed and updated as appropriate.   Please see review of systems for further details on the patient's review from today.   Objective:   Physical Exam:  There were no vitals taken for this visit.  Physical Exam Constitutional:      General: She is not in acute distress. Musculoskeletal:        General: No deformity.  Neurological:  Mental Status: She is alert and oriented to person, place, and time.     Coordination: Coordination normal.  Psychiatric:        Attention and Perception: Attention and perception normal. She does not perceive auditory or visual hallucinations.        Mood and Affect: Mood normal. Mood is not anxious or depressed. Affect is not labile, blunt, angry or inappropriate.        Speech:  Speech normal.        Behavior: Behavior normal.        Thought Content: Thought content normal. Thought content is not paranoid or delusional. Thought content does not include homicidal or suicidal ideation. Thought content does not include homicidal or suicidal plan.        Cognition and Memory: Cognition and memory normal.        Judgment: Judgment normal.     Comments: Insight intact     Lab Review:  No results found for: NA, K, CL, CO2, GLUCOSE, BUN, CREATININE, CALCIUM, PROT, ALBUMIN, AST, ALT, ALKPHOS, BILITOT, GFRNONAA, GFRAA  No results found for: WBC, RBC, HGB, HCT, PLT, MCV, MCH, MCHC, RDW, LYMPHSABS, MONOABS, EOSABS, BASOSABS  No results found for: POCLITH, LITHIUM   No results found for: PHENYTOIN, PHENOBARB, VALPROATE, CBMZ   .res Assessment: Plan:    Plan:  PDMP reviewed  Consider Restora-Z for sleep. Also plans to make an appt with nuerology to discuss sleep difficulties.   Zoloft 200mg  daily - PCP prescribes  Xanax  1mg  as needed   NAC tabs daily  PCP at [facility].   Therapist - previously seen by Boby Signe Mitchell testing - results given.   RTC 4 weeks  25 minutes spent dedicated to the care of this patient on the date of this encounter to include pre-visit review of records, ordering of medication, post visit documentation, and face-to-face time with the patient discussing MDD, GAD, obsessional thoughts and insomnia. Discussed continuing current medication regimen.  Discussed potential benefits, risk, and side effects of benzodiazepines to include potential risk of tolerance and dependence, as well as possible drowsiness.  Advised patient not to drive if experiencing drowsiness and to take lowest possible effective dose to minimize risk of dependence and tolerance.   Patient advised to contact office with any questions, adverse effects, or acute worsening in signs and  symptoms.  There are no diagnoses linked to this encounter.   Please see After Visit Summary for patient specific instructions.  Future Appointments  Date Time Provider Department Center  05/06/2024  9:00 AM Terelle Dobler Nattalie, NP [facility] None    No orders of the defined types were placed in this encounter.   -------------------------------

## 2024-05-07 ENCOUNTER — Encounter: Payer: Self-pay | Admitting: Adult Health

## 2024-06-01 ENCOUNTER — Telehealth: Admitting: Adult Health

## 2024-06-01 ENCOUNTER — Encounter: Payer: Self-pay | Admitting: Adult Health

## 2024-06-01 DIAGNOSIS — F411 Generalized anxiety disorder: Secondary | ICD-10-CM | POA: Diagnosis not present

## 2024-06-01 DIAGNOSIS — F909 Attention-deficit hyperactivity disorder, unspecified type: Secondary | ICD-10-CM

## 2024-06-01 DIAGNOSIS — F902 Attention-deficit hyperactivity disorder, combined type: Secondary | ICD-10-CM

## 2024-06-01 DIAGNOSIS — G4700 Insomnia, unspecified: Secondary | ICD-10-CM

## 2024-06-01 DIAGNOSIS — F422 Mixed obsessional thoughts and acts: Secondary | ICD-10-CM | POA: Diagnosis not present

## 2024-06-01 DIAGNOSIS — F331 Major depressive disorder, recurrent, moderate: Secondary | ICD-10-CM | POA: Diagnosis not present

## 2024-06-01 MED ORDER — ESZOPICLONE 1 MG PO TABS: ORAL_TABLET | ORAL | 2 refills | Status: AC

## 2024-06-01 MED ORDER — AMPHETAMINE-DEXTROAMPHETAMINE 20 MG PO TABS: 20.0000 mg | ORAL_TABLET | Freq: Every day | ORAL | 0 refills | Status: AC

## 2024-06-01 MED ORDER — ALPRAZOLAM 1 MG PO TABS: 1.0000 mg | ORAL_TABLET | Freq: Two times a day (BID) | ORAL | 2 refills | Status: AC | PRN

## 2024-06-01 NOTE — Progress Notes (Signed)
 Tiffany Cummings [id number] September 27, 1985 38 y.o.  Virtual Visit via Video Note  I connected with pt @ on 06/01/24 at  2:30 PM EST by a video enabled telemedicine application and verified that I am speaking with the correct person using two identifiers.   I discussed the limitations of evaluation and management by telemedicine and the availability of in person appointments. The patient expressed understanding and agreed to proceed.  I discussed the assessment and treatment plan with the patient. The patient was provided an opportunity to ask questions and all were answered. The patient agreed with the plan and demonstrated an understanding of the instructions.   The patient was advised to call back or seek an in-person evaluation if the symptoms worsen or if the condition fails to improve as anticipated.  I provided 25 minutes of non-face-to-face time during this encounter.  The patient was located at home.  The provider was located at [facility].   Angeline LOISE Sayers, NP   Subjective:   Patient ID:  Tiffany Cummings is a 38 y.o. (DOB 1985/11/23) female.  Chief Complaint: No chief complaint on file.   HPI KENYA KOOK presents for follow-up of MDD, GAD, obsessional thoughts, ADD and insomnia.  Describes mood today as ok. Pleasant. Denies tearfulness. Mood symptoms - denies depression. Reports stable interest, but lacks  motivation. Reports anxiety every day. Reports situational irritability. Denies panic attacks. Reports worry, rumination and over thinking. Reports obsessive thoughts and acts - hand washing, etc. Reports mood as stable. Stating I feel like I'm doing alright, all things considered. Taking medications as prescribed.  Energy levels lower - I still feel tired all the time. Active, has a regular exercise routine. Walking daily. Enjoys some usual interests and activities. Single. Not dating. Lives with mother - 4 cats and 2 dogs. Spending time with family and  friends. Appetite adequate. Weight loss - 160 from 165 pounds. Reports ongoing sleeping difficulties. Averages 4 hours of broken sleep. Reports focus and concentration difficulties - I'm able to do my job, but it is harder than it normally is. Completing tasks. Managing aspects of household. 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 Denies SI or HI.  Denies AH or VH. Denies self harm.  Denies substance use.   Previous medication trials: Zoloft, Ativan - crying all the time, Ambien , Lunesta , Hydroxyzine - bad dreams, Adderall XR, Adderall IR, Clonidine , Restoril , Trazadone, Belsomra, Elavil, Clonazepam , Gabapentin    Review of Systems:  Review of Systems  Musculoskeletal:  Negative for gait problem.  Neurological:  Negative for tremors.  Psychiatric/Behavioral:         Please refer to HPI    Medications: I have reviewed the patient's current medications.  Current Outpatient Medications  Medication Sig Dispense Refill   ALPRAZolam  (XANAX ) 1 MG tablet Take 1 tablet (1 mg total) by mouth 2 (two) times daily as needed for anxiety. 60 tablet 2   clonazePAM  (KLONOPIN ) 1 MG tablet Take 1 tablet (1 mg total) by mouth daily. (Patient not taking: Reported on 03/26/2024) 30 tablet 0   eszopiclone  (LUNESTA ) 1 MG TABS tablet Take one tablet at bedtime. 30 tablet 0   gabapentin  (NEURONTIN ) 100 MG capsule Take 1 capsule (100 mg total) by mouth at bedtime. (Patient not taking: Reported on 03/26/2024) 30 capsule 2   sertraline (ZOLOFT) 50 MG tablet Take 200 mg by mouth daily.     No current facility-administered medications for this visit.  Medication Side Effects: None  Allergies:  Allergies  Allergen Reactions   Flagyl [Metronidazole] Nausea And Vomiting   Ceclor [Cefaclor] Rash   Septra [Sulfamethoxazole-Trimethoprim] Rash    No past medical history on file.  No family history on file.  Social History   Socioeconomic History   Marital  status: Single    Spouse name: Not on file   Number of children: Not on file   Years of education: Not on file   Highest education level: Not on file  Occupational History   Not on file  Tobacco Use   Smoking status: Never   Smokeless tobacco: Never  Vaping Use   Vaping status: Never Used  Substance and Sexual Activity   Alcohol use: No   Drug use: No   Sexual activity: Not on file  Other Topics Concern   Not on file  Social History Narrative   Not on file   Social Drivers of Health   Financial Resource Strain: Not on file  Food Insecurity: Not on file  Transportation Needs: Not on file  Physical Activity: Not on file  Stress: Not on file  Social Connections: Unknown (11/19/2021)   Received from [facility]   Social Network    Social Network: Not on file  Intimate Partner Violence: Unknown (11/19/2021)   Received from [facility]   HITS    Physically Hurt: Not on file    Insult or Talk Down To: Not on file    Threaten Physical Harm: Not on file    Scream or Curse: Not on file    Past Medical History, Surgical history, Social history, and Family history were reviewed and updated as appropriate.   Please see review of systems for further details on the patient's review from today.   Objective:   Physical Exam:  There were no vitals taken for this visit.  Physical Exam Constitutional:      General: She is not in acute distress. Musculoskeletal:        General: No deformity.  Neurological:     Mental Status: She is alert and oriented to person, place, and time.     Coordination: Coordination normal.  Psychiatric:        Attention and Perception: Attention and perception normal. She does not perceive auditory or visual hallucinations.        Mood and Affect: Mood normal. Mood is not anxious or depressed. Affect is not labile, blunt, angry or inappropriate.        Speech: Speech normal.        Behavior: Behavior normal.        Thought Content: Thought content  normal. Thought content is not paranoid or delusional. Thought content does not include homicidal or suicidal ideation. Thought content does not include homicidal or suicidal plan.        Cognition and Memory: Cognition and memory normal.        Judgment: Judgment normal.     Comments: Insight intact     Lab Review:  No results found for: NA, K, CL, CO2, GLUCOSE, BUN, CREATININE, CALCIUM, PROT, ALBUMIN, AST, ALT, ALKPHOS, BILITOT, GFRNONAA, GFRAA  No results found for: WBC, RBC, HGB, HCT, PLT, MCV, MCH, MCHC, RDW, LYMPHSABS, MONOABS, EOSABS, BASOSABS  No results found for: POCLITH, LITHIUM   No results found for: PHENYTOIN, PHENOBARB, VALPROATE, CBMZ   .res Assessment: Plan:    Plan:  PDMP reviewed  Restart Lunesta  1mg  at bedtime Increase Xanax  1mg  to 2mg  as needed for  sleep  Zoloft 200mg  daily - PCP prescribes  NAC tabs daily  PCP at [facility].   Therapist - previously seen by Boby Signe Mitchell testing - results given.   RTC 4 weeks  25 minutes spent dedicated to the care of this patient on the date of this encounter to include pre-visit review of records, ordering of medication, post visit documentation, and face-to-face time with the patient discussing MDD, GAD, obsessional thoughts and insomnia. Discussed continuing current medication regimen.  Discussed potential benefits, risk, and side effects of benzodiazepines to include potential risk of tolerance and dependence, as well as possible drowsiness.  Advised patient not to drive if experiencing drowsiness and to take lowest possible effective dose to minimize risk of dependence and tolerance.   Patient advised to contact office with any questions, adverse effects, or acute worsening in signs and symptoms.   There are no diagnoses linked to this encounter.   Please see After Visit Summary for patient specific  instructions.  Future Appointments  Date Time Provider Department Center  06/01/2024  2:30 PM Philamena Kramar Nattalie, NP [facility] None    No orders of the defined types were placed in this encounter.     -------------------------------
